# Patient Record
Sex: Female | Born: 1935 | Race: White | Hispanic: No | Smoking: Never smoker
Health system: Southern US, Community
[De-identification: ages and names within clinical notes are randomized; demographics above are authoritative.]

## PROBLEM LIST (undated history)

## (undated) DIAGNOSIS — M810 Age-related osteoporosis without current pathological fracture: Secondary | ICD-10-CM

## (undated) DIAGNOSIS — S52601A Unspecified fracture of lower end of right ulna, initial encounter for closed fracture: Secondary | ICD-10-CM

## (undated) DIAGNOSIS — S52501A Unspecified fracture of the lower end of right radius, initial encounter for closed fracture: Secondary | ICD-10-CM

## (undated) DIAGNOSIS — M1990 Unspecified osteoarthritis, unspecified site: Secondary | ICD-10-CM

## (undated) DIAGNOSIS — Z972 Presence of dental prosthetic device (complete) (partial): Secondary | ICD-10-CM

## (undated) DIAGNOSIS — Z8781 Personal history of (healed) traumatic fracture: Secondary | ICD-10-CM

## (undated) DIAGNOSIS — I10 Essential (primary) hypertension: Secondary | ICD-10-CM

## (undated) DIAGNOSIS — J45909 Unspecified asthma, uncomplicated: Secondary | ICD-10-CM

## (undated) DIAGNOSIS — M159 Polyosteoarthritis, unspecified: Secondary | ICD-10-CM

## (undated) HISTORY — PX: KIDNEY STONE SURGERY: SHX686

## (undated) HISTORY — PX: COLONOSCOPY: SHX174

## (undated) HISTORY — DX: Polyosteoarthritis, unspecified: M15.9

## (undated) HISTORY — DX: Personal history of (healed) traumatic fracture: Z87.81

## (undated) HISTORY — PX: TUBAL LIGATION: SHX77

---

## 1988-06-06 HISTORY — PX: CHOLECYSTECTOMY: SHX55

## 1999-08-11 ENCOUNTER — Encounter: Payer: Self-pay | Admitting: Cardiology

## 1999-08-11 ENCOUNTER — Encounter: Admission: RE | Admit: 1999-08-11 | Discharge: 1999-08-11 | Payer: Self-pay | Admitting: Cardiology

## 1999-08-12 ENCOUNTER — Ambulatory Visit (HOSPITAL_COMMUNITY): Admission: RE | Admit: 1999-08-12 | Discharge: 1999-08-12 | Payer: Self-pay

## 1999-09-10 ENCOUNTER — Ambulatory Visit (HOSPITAL_BASED_OUTPATIENT_CLINIC_OR_DEPARTMENT_OTHER): Admission: RE | Admit: 1999-09-10 | Discharge: 1999-09-10 | Payer: Self-pay | Admitting: Plastic Surgery

## 2000-11-02 ENCOUNTER — Encounter: Admission: RE | Admit: 2000-11-02 | Discharge: 2000-11-02 | Payer: Self-pay | Admitting: Family Medicine

## 2000-11-02 ENCOUNTER — Encounter: Payer: Self-pay | Admitting: Family Medicine

## 2001-02-08 ENCOUNTER — Ambulatory Visit (HOSPITAL_COMMUNITY): Admission: RE | Admit: 2001-02-08 | Discharge: 2001-02-08 | Payer: Self-pay | Admitting: Gastroenterology

## 2005-06-13 ENCOUNTER — Encounter: Admission: RE | Admit: 2005-06-13 | Discharge: 2005-06-13 | Payer: Self-pay | Admitting: Family Medicine

## 2005-07-20 ENCOUNTER — Encounter: Admission: RE | Admit: 2005-07-20 | Discharge: 2005-07-20 | Payer: Self-pay | Admitting: Neurology

## 2006-01-27 ENCOUNTER — Encounter: Admission: RE | Admit: 2006-01-27 | Discharge: 2006-01-27 | Payer: Self-pay | Admitting: Family Medicine

## 2006-03-09 ENCOUNTER — Encounter: Admission: RE | Admit: 2006-03-09 | Discharge: 2006-03-09 | Payer: Self-pay | Admitting: Family Medicine

## 2006-03-15 ENCOUNTER — Encounter: Admission: RE | Admit: 2006-03-15 | Discharge: 2006-03-15 | Payer: Self-pay | Admitting: *Deleted

## 2006-03-28 ENCOUNTER — Encounter: Admission: RE | Admit: 2006-03-28 | Discharge: 2006-03-28 | Payer: Self-pay | Admitting: Orthopaedic Surgery

## 2011-12-21 DIAGNOSIS — E782 Mixed hyperlipidemia: Secondary | ICD-10-CM | POA: Insufficient documentation

## 2011-12-21 DIAGNOSIS — M810 Age-related osteoporosis without current pathological fracture: Secondary | ICD-10-CM | POA: Insufficient documentation

## 2011-12-21 DIAGNOSIS — E559 Vitamin D deficiency, unspecified: Secondary | ICD-10-CM | POA: Insufficient documentation

## 2013-01-07 ENCOUNTER — Encounter (HOSPITAL_BASED_OUTPATIENT_CLINIC_OR_DEPARTMENT_OTHER): Payer: Self-pay | Admitting: *Deleted

## 2013-01-07 NOTE — Progress Notes (Signed)
Talked with daughter-she will bring her for [id number]

## 2013-01-07 NOTE — Progress Notes (Signed)
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

## 2013-01-10 ENCOUNTER — Encounter (HOSPITAL_BASED_OUTPATIENT_CLINIC_OR_DEPARTMENT_OTHER)
Admission: RE | Admit: 2013-01-10 | Discharge: 2013-01-10 | Disposition: A | Discharge status: Home / Self Care | Payer: Medicare Other | Source: Ambulatory Visit | Attending: Orthopedic Surgery | Admitting: Orthopedic Surgery

## 2013-01-10 ENCOUNTER — Other Ambulatory Visit: Payer: Self-pay

## 2013-01-10 DIAGNOSIS — S52609A Unspecified fracture of lower end of unspecified ulna, initial encounter for closed fracture: Secondary | ICD-10-CM | POA: Insufficient documentation

## 2013-01-10 DIAGNOSIS — Z01810 Encounter for preprocedural cardiovascular examination: Secondary | ICD-10-CM | POA: Insufficient documentation

## 2013-01-10 DIAGNOSIS — S52509A Unspecified fracture of the lower end of unspecified radius, initial encounter for closed fracture: Secondary | ICD-10-CM | POA: Insufficient documentation

## 2013-01-10 DIAGNOSIS — X58XXXA Exposure to other specified factors, initial encounter: Secondary | ICD-10-CM | POA: Insufficient documentation

## 2013-01-10 LAB — BASIC METABOLIC PANEL
BUN: 23 mg/dL (ref 6–23)
Chloride: 98 mEq/L (ref 96–112)
Glucose, Bld: 95 mg/dL (ref 70–99)
Potassium: 3.3 mEq/L — ABNORMAL LOW (ref 3.5–5.1)
Sodium: 140 mEq/L (ref 135–145)

## 2013-01-11 ENCOUNTER — Ambulatory Visit (HOSPITAL_BASED_OUTPATIENT_CLINIC_OR_DEPARTMENT_OTHER): Payer: Medicare Other | Admitting: Certified Registered Nurse Anesthetist

## 2013-01-11 ENCOUNTER — Ambulatory Visit (HOSPITAL_BASED_OUTPATIENT_CLINIC_OR_DEPARTMENT_OTHER)
Admission: RE | Admit: 2013-01-11 | Discharge: 2013-01-11 | Disposition: A | Discharge status: Home / Self Care | Payer: Medicare Other | Source: Ambulatory Visit | Attending: Orthopedic Surgery | Admitting: Orthopedic Surgery

## 2013-01-11 ENCOUNTER — Encounter (HOSPITAL_BASED_OUTPATIENT_CLINIC_OR_DEPARTMENT_OTHER)
Admission: RE | Disposition: A | Discharge status: Home / Self Care | Payer: Self-pay | Source: Ambulatory Visit | Attending: Orthopedic Surgery

## 2013-01-11 ENCOUNTER — Encounter (HOSPITAL_BASED_OUTPATIENT_CLINIC_OR_DEPARTMENT_OTHER): Payer: Self-pay

## 2013-01-11 ENCOUNTER — Encounter (HOSPITAL_BASED_OUTPATIENT_CLINIC_OR_DEPARTMENT_OTHER): Payer: Self-pay | Admitting: Certified Registered Nurse Anesthetist

## 2013-01-11 DIAGNOSIS — Y929 Unspecified place or not applicable: Secondary | ICD-10-CM | POA: Insufficient documentation

## 2013-01-11 DIAGNOSIS — S52501A Unspecified fracture of the lower end of right radius, initial encounter for closed fracture: Secondary | ICD-10-CM

## 2013-01-11 DIAGNOSIS — S52609A Unspecified fracture of lower end of unspecified ulna, initial encounter for closed fracture: Secondary | ICD-10-CM | POA: Insufficient documentation

## 2013-01-11 DIAGNOSIS — I10 Essential (primary) hypertension: Secondary | ICD-10-CM | POA: Insufficient documentation

## 2013-01-11 DIAGNOSIS — J45909 Unspecified asthma, uncomplicated: Secondary | ICD-10-CM | POA: Insufficient documentation

## 2013-01-11 DIAGNOSIS — S52509A Unspecified fracture of the lower end of unspecified radius, initial encounter for closed fracture: Secondary | ICD-10-CM | POA: Insufficient documentation

## 2013-01-11 DIAGNOSIS — Z79899 Other long term (current) drug therapy: Secondary | ICD-10-CM | POA: Insufficient documentation

## 2013-01-11 DIAGNOSIS — X58XXXA Exposure to other specified factors, initial encounter: Secondary | ICD-10-CM | POA: Insufficient documentation

## 2013-01-11 DIAGNOSIS — M810 Age-related osteoporosis without current pathological fracture: Secondary | ICD-10-CM | POA: Insufficient documentation

## 2013-01-11 DIAGNOSIS — M129 Arthropathy, unspecified: Secondary | ICD-10-CM | POA: Insufficient documentation

## 2013-01-11 DIAGNOSIS — Z01812 Encounter for preprocedural laboratory examination: Secondary | ICD-10-CM | POA: Insufficient documentation

## 2013-01-11 HISTORY — DX: Unspecified fracture of the lower end of right radius, initial encounter for closed fracture: S52.501A

## 2013-01-11 HISTORY — DX: Unspecified asthma, uncomplicated: J45.909

## 2013-01-11 HISTORY — DX: Essential (primary) hypertension: I10

## 2013-01-11 HISTORY — DX: Presence of dental prosthetic device (complete) (partial): Z97.2

## 2013-01-11 HISTORY — PX: OPEN REDUCTION INTERNAL FIXATION (ORIF) DISTAL RADIAL FRACTURE: SHX5989

## 2013-01-11 HISTORY — DX: Age-related osteoporosis without current pathological fracture: M81.0

## 2013-01-11 HISTORY — DX: Unspecified osteoarthritis, unspecified site: M19.90

## 2013-01-11 HISTORY — DX: Unspecified fracture of lower end of right ulna, initial encounter for closed fracture: S52.601A

## 2013-01-11 LAB — POCT HEMOGLOBIN-HEMACUE: Hemoglobin: 14.7 g/dL (ref 12.0–15.0)

## 2013-01-11 SURGERY — OPEN REDUCTION INTERNAL FIXATION (ORIF) DISTAL RADIUS FRACTURE
Anesthesia: General | Site: Wrist | Laterality: Right | Wound class: Clean

## 2013-01-11 MED ORDER — SENNA-DOCUSATE SODIUM 8.6-50 MG PO TABS: 2.0000 | ORAL_TABLET | Freq: Every day | ORAL | Status: DC

## 2013-01-11 MED ORDER — BUPIVACAINE-EPINEPHRINE PF 0.5-1:200000 % IJ SOLN
INTRAMUSCULAR | Status: DC | PRN
  Administered 2013-01-11: 25 mL

## 2013-01-11 MED ORDER — FENTANYL CITRATE 0.05 MG/ML IJ SOLN: 25.0000 ug | INTRAMUSCULAR | Status: DC | PRN

## 2013-01-11 MED ORDER — MIDAZOLAM HCL 2 MG/2ML IJ SOLN
1.0000 mg | INTRAMUSCULAR | Status: DC | PRN
  Administered 2013-01-11: 1 mg via INTRAVENOUS

## 2013-01-11 MED ORDER — CEFAZOLIN SODIUM-DEXTROSE 2-3 GM-% IV SOLR
2.0000 g | Freq: Once | INTRAVENOUS | Status: AC
  Administered 2013-01-11: 2 g via INTRAVENOUS

## 2013-01-11 MED ORDER — LACTATED RINGERS IV SOLN
INTRAVENOUS | Status: DC
  Administered 2013-01-11: 08:00:00 via INTRAVENOUS

## 2013-01-11 MED ORDER — HYDROCODONE-ACETAMINOPHEN 10-325 MG PO TABS: 1.0000 | ORAL_TABLET | Freq: Four times a day (QID) | ORAL | Status: DC | PRN

## 2013-01-11 MED ORDER — ONDANSETRON HCL 4 MG/2ML IJ SOLN: 4.0000 mg | Freq: Once | INTRAMUSCULAR | Status: DC | PRN

## 2013-01-11 MED ORDER — LIDOCAINE HCL (CARDIAC) 20 MG/ML IV SOLN
INTRAVENOUS | Status: DC | PRN
  Administered 2013-01-11: 50 mg via INTRAVENOUS

## 2013-01-11 MED ORDER — ONDANSETRON HCL 4 MG/2ML IJ SOLN
INTRAMUSCULAR | Status: DC | PRN
  Administered 2013-01-11: 4 mg via INTRAVENOUS

## 2013-01-11 MED ORDER — FENTANYL CITRATE 0.05 MG/ML IJ SOLN
50.0000 ug | INTRAMUSCULAR | Status: DC | PRN
  Administered 2013-01-11: 100 ug via INTRAVENOUS

## 2013-01-11 MED ORDER — DEXAMETHASONE SODIUM PHOSPHATE 4 MG/ML IJ SOLN
INTRAMUSCULAR | Status: DC | PRN
  Administered 2013-01-11: 4 mg

## 2013-01-11 MED ORDER — PROPOFOL 10 MG/ML IV BOLUS
INTRAVENOUS | Status: DC | PRN
  Administered 2013-01-11: 100 mg via INTRAVENOUS

## 2013-01-11 MED ORDER — DEXAMETHASONE SODIUM PHOSPHATE 10 MG/ML IJ SOLN
INTRAMUSCULAR | Status: DC | PRN
  Administered 2013-01-11: 10 mg via INTRAVENOUS

## 2013-01-11 SURGICAL SUPPLY — 69 items
APL SKNCLS STERI-STRIP NONHPOA (GAUZE/BANDAGES/DRESSINGS) ×1
BANDAGE ELASTIC 3 VELCRO ST LF (GAUZE/BANDAGES/DRESSINGS) IMPLANT
BANDAGE ELASTIC 4 VELCRO ST LF (GAUZE/BANDAGES/DRESSINGS) IMPLANT
BENZOIN TINCTURE PRP APPL 2/3 (GAUZE/BANDAGES/DRESSINGS) ×2 IMPLANT
BIT DRILL 2 FAST STEP (BIT) ×1 IMPLANT
BIT DRILL 2.5X4 QC (BIT) ×1 IMPLANT
BLADE MINI RND TIP GREEN BEAV (BLADE) IMPLANT
BLADE SURG 15 STRL LF DISP TIS (BLADE) ×1 IMPLANT
BLADE SURG 15 STRL SS (BLADE) ×2
BNDG CMPR 9X4 STRL LF SNTH (GAUZE/BANDAGES/DRESSINGS) ×1
BNDG COHESIVE 4X5 TAN STRL (GAUZE/BANDAGES/DRESSINGS) IMPLANT
BNDG ESMARK 4X9 LF (GAUZE/BANDAGES/DRESSINGS) ×2 IMPLANT
CLOTH BEACON ORANGE TIMEOUT ST (SAFETY) ×2 IMPLANT
COVER TABLE BACK 60X90 (DRAPES) ×2 IMPLANT
CUFF TOURNIQUET SINGLE 18IN (TOURNIQUET CUFF) ×1 IMPLANT
DECANTER SPIKE VIAL GLASS SM (MISCELLANEOUS) ×2 IMPLANT
DRAPE EXTREMITY T 121X128X90 (DRAPE) ×2 IMPLANT
DRAPE INCISE IOBAN 66X45 STRL (DRAPES) ×1 IMPLANT
DRAPE OEC MINIVIEW 54X84 (DRAPES) ×2 IMPLANT
DRAPE SURG 17X23 STRL (DRAPES) ×2 IMPLANT
DRAPE U 20/CS (DRAPES) ×2 IMPLANT
DURAPREP 26ML APPLICATOR (WOUND CARE) ×2 IMPLANT
ELECT REM PT RETURN 9FT ADLT (ELECTROSURGICAL) ×2
ELECTRODE REM PT RTRN 9FT ADLT (ELECTROSURGICAL) ×1 IMPLANT
GLOVE BIO SURGEON STRL SZ 6.5 (GLOVE) ×1 IMPLANT
GLOVE BIOGEL PI IND STRL 7.0 (GLOVE) IMPLANT
GLOVE BIOGEL PI IND STRL 8 (GLOVE) ×2 IMPLANT
GLOVE BIOGEL PI INDICATOR 7.0 (GLOVE) ×1
GLOVE BIOGEL PI INDICATOR 8 (GLOVE) ×2
GLOVE ORTHO TXT STRL SZ7.5 (GLOVE) ×2 IMPLANT
GLOVE SURG ORTHO 8.0 STRL STRW (GLOVE) ×2 IMPLANT
GOWN BRE IMP PREV XXLGXLNG (GOWN DISPOSABLE) ×4 IMPLANT
K-WIRE .062X4 (WIRE) ×2 IMPLANT
NDL HYPO 25X1 1.5 SAFETY (NEEDLE) IMPLANT
NEEDLE HYPO 25X1 1.5 SAFETY (NEEDLE) IMPLANT
NS IRRIG 1000ML POUR BTL (IV SOLUTION) ×2 IMPLANT
PACK BASIN DAY SURGERY FS (CUSTOM PROCEDURE TRAY) ×2 IMPLANT
PAD CAST 3X4 CTTN HI CHSV (CAST SUPPLIES) IMPLANT
PAD CAST 4YDX4 CTTN HI CHSV (CAST SUPPLIES) IMPLANT
PADDING CAST ABS 4INX4YD NS (CAST SUPPLIES) ×1
PADDING CAST ABS COTTON 4X4 ST (CAST SUPPLIES) ×1 IMPLANT
PADDING CAST COTTON 3X4 STRL (CAST SUPPLIES)
PADDING CAST COTTON 4X4 STRL (CAST SUPPLIES)
PEG SUBCHONDRAL SMOOTH 2.0X18 (Peg) ×2 IMPLANT
PEG SUBCHONDRAL SMOOTH 2.0X20 (Peg) ×4 IMPLANT
PENCIL BUTTON HOLSTER BLD 10FT (ELECTRODE) ×2 IMPLANT
PLATE SHORT 21.6X48.9 NRRW RT (Plate) ×1 IMPLANT
SCREW BN 12X3.5XNS CORT TI (Screw) IMPLANT
SCREW CORT 3.5X12 (Screw) ×4 IMPLANT
SLEEVE SCD COMPRESS KNEE MED (MISCELLANEOUS) ×2 IMPLANT
SPLINT PLASTER CAST XFAST 3X15 (CAST SUPPLIES) IMPLANT
SPLINT PLASTER XTRA FASTSET 3X (CAST SUPPLIES)
SPONGE GAUZE 4X4 12PLY (GAUZE/BANDAGES/DRESSINGS) ×2 IMPLANT
STOCKINETTE 4X48 STRL (DRAPES) ×2 IMPLANT
STRIP CLOSURE SKIN 1/2X4 (GAUZE/BANDAGES/DRESSINGS) ×2 IMPLANT
SUCTION FRAZIER TIP 10 FR DISP (SUCTIONS) ×2 IMPLANT
SUT ETHILON 3 0 PS 1 (SUTURE) IMPLANT
SUT ETHILON 4 0 PS 2 18 (SUTURE) IMPLANT
SUT MNCRL AB 4-0 PS2 18 (SUTURE) ×1 IMPLANT
SUT VIC AB 0 CT1 27 (SUTURE)
SUT VIC AB 0 CT1 27XBRD ANBCTR (SUTURE) IMPLANT
SUT VIC AB 2-0 SH 27 (SUTURE) ×2
SUT VIC AB 2-0 SH 27XBRD (SUTURE) IMPLANT
SUT VICRYL 3-0 CR8 SH (SUTURE) ×2 IMPLANT
SYR BULB 3OZ (MISCELLANEOUS) ×2 IMPLANT
SYR CONTROL 10ML LL (SYRINGE) IMPLANT
TOWEL OR 17X24 6PK STRL BLUE (TOWEL DISPOSABLE) ×2 IMPLANT
TUBE CONNECTING 20X1/4 (TUBING) ×2 IMPLANT
UNDERPAD 30X30 INCONTINENT (UNDERPADS AND DIAPERS) ×2 IMPLANT

## 2013-01-11 NOTE — Anesthesia Procedure Notes (Addendum)
Anesthesia Regional Block:  Supraclavicular block  Pre-Anesthetic Checklist: ,, timeout performed, Correct Patient, Correct Site, Correct Laterality, Correct Procedure, Correct Position, site marked, Risks and benefits discussed,  Surgical consent,  Pre-op evaluation,  At surgeon's request and post-op pain management  Laterality: Right and Upper  Prep: chloraprep       Needles:  Injection technique: Single-shot  Needle Type: Echogenic Stimulator Needle     Needle Length: 5cm 5 cm Needle Gauge: 21    Additional Needles:  Procedures: ultrasound guided (picture in chart) Supraclavicular block Narrative:  Start time: 01/11/2013 8:01 AM End time: 01/11/2013 8:10 AM Injection made incrementally with aspirations every 5 mL.  Performed by: Personally  Anesthesiologist: Sheldon Silvan  Supraclavicular block Procedure Name: LMA Insertion Performed by: York Grice Pre-anesthesia Checklist: Patient identified, Timeout performed, Emergency Drugs available, Suction available and Patient being monitored Patient Re-evaluated:Patient Re-evaluated prior to inductionOxygen Delivery Method: Circle system utilized Preoxygenation: Pre-oxygenation with 100% oxygen Intubation Type: IV induction Ventilation: Mask ventilation without difficulty LMA: LMA inserted LMA Size: 4.0 Number of attempts: 1 Placement Confirmation: breath sounds checked- equal and bilateral and positive ETCO2 Tube secured with: Tape Dental Injury: Teeth and Oropharynx as per pre-operative assessment

## 2013-01-11 NOTE — Anesthesia Preprocedure Evaluation (Signed)
Anesthesia Evaluation  Patient identified by MRN, date of birth, ID band Patient awake    Reviewed: Allergy & Precautions, H&P , NPO status , Patient's Chart, lab work & pertinent test results  Airway Mallampati: I TM Distance: >3 FB Neck ROM: Full    Dental  (+) Upper Dentures and Dental Advisory Given   Pulmonary  breath sounds clear to auscultation        Cardiovascular hypertension, Pt. on medications Rhythm:Regular Rate:Normal     Neuro/Psych    GI/Hepatic   Endo/Other    Renal/GU      Musculoskeletal   Abdominal   Peds  Hematology   Anesthesia Other Findings   Reproductive/Obstetrics                           Anesthesia Physical Anesthesia Plan  ASA: II  Anesthesia Plan: General   Post-op Pain Management:    Induction: Intravenous  Airway Management Planned: LMA  Additional Equipment:   Intra-op Plan:   Post-operative Plan: Extubation in OR  Informed Consent: I have reviewed the patients History and Physical, chart, labs and discussed the procedure including the risks, benefits and alternatives for the proposed anesthesia with the patient or authorized representative who has indicated his/her understanding and acceptance.     Plan Discussed with: CRNA, Anesthesiologist and Surgeon  Anesthesia Plan Comments:         Anesthesia Quick Evaluation

## 2013-01-11 NOTE — Op Note (Signed)
01/11/2013  10:03 AM  PATIENT:  Monica Caldwell    PRE-OPERATIVE DIAGNOSIS:  Right distal radius and ulna fracture  POST-OPERATIVE DIAGNOSIS:  Same  PROCEDURE:  ORIF DISTAL RADIUS FRACTURE, 2 pieces, with percutaneous skeletal fixation of the distal ulna  SURGEON:  Eulas Post, MD  PHYSICIAN ASSISTANT: Janace Litten, OPA-C, present and scrubbed throughout the case, critical for completion in a timely fashion, and for retraction, instrumentation, and closure.  ANESTHESIA:   General  PREOPERATIVE INDICATIONS:  Monica Caldwell is a  79 y.o. female who had a displaced distal radius and ulna fracture, and elected for surgical management.  The risks benefits and alternatives were discussed with the patient preoperatively including but not limited to the risks of infection, bleeding, nerve injury, cardiopulmonary complications, the need for revision surgery, tendon rupture, hardware prominence, hardware failure, nonunion, malunion, post-traumatic arthritis, regional pain syndrome, among others, and the patient was willing to proceed.  OPERATIVE IMPLANTS: Biomet DVR volar plate with 2 proximal cortical screws and multiple distal interlocking smooth pegs, using the short narrow plate. I used a single 0.625 K wire for the ulna.  OPERATIVE FINDINGS: Comminution of the distal radius fracture, with shortening, and dorsal angulation, with loss of height of the distal ulna.  OPERATIVE PROCEDURE: The patient was brought to the operating room and placed in the supine position. General anesthesia was administered. IV antibiotics were given. Time out was performed. The upper extremity was prepped and draped in usual sterile fashion. The arm was elevated and exsanguinated and the tourniquet was inflated at hg.    Volar approach to the distal radius was carried out, and the flexor carpi radialis was retracted radially. The radial artery was protected throughout the case.  Deep dissection was  carried down, and the pronator quadratus was elevated off of the radius. The fracture site was identified and cleaned and reduced anatomically. This keyed into place nicely.   I held this provisionally with a K wire, and C-arm used to confirm alignment.   I had restored height and inclination and then applied a volar plate. A K wire was used to confirm appropriate position of the plate, and once I was satisfied with the overall alignment I was able to secure the plate proximally with a cortical screw.   I then secured the fracture with multiple smooth interlocking pegs distally, and confirmed that none of these were in the joint, and none of these were penetrating the dorsal cortex. I also secured the plate proximally with one more cortical screw. I confirmed final screw length and position with live fluoroscopy, and after reduction of the radius, the ulna was effectively anatomically reduced. It may been a little bit short, but overall satisfactory position. I placed a single bicortical 0.0625 inch K wire across the ulnar fracture site, and was satisfied with the fixation, although admittedly this was minimal fixation for the ulna, but I felt that more fixation would introduce increased risk for pin tract infection as well as problems with the DRUJ joint, and I did not feel that pinning the DRUJ joint was appropriate, do to risk for pin fracture, and loss of motion and stiffness, and so then I turned my attention back to the radial wound.  The wounds were irrigated copiously, and I repaired the pronator quadratus as well as possible with 2-0 Vicryl followed by 3-0 subcutaneous Vicryl for the skin and Steri-Strips and sterile gauze and a volar splint. The tourniquet was released. She was awakened and returned  back in stable and satisfactory condition. There were no complications and She tolerated the procedure well.

## 2013-01-11 NOTE — Anesthesia Postprocedure Evaluation (Signed)
  Anesthesia Post-op Note  Patient: Monica Caldwell  Procedure(s) Performed: Procedure(s): OPEN REDUCTION INTERNAL FIXATION (ORIF) DISTAL AND ULNA RADIAL FRACTURE (Right)  Patient Location: PACU  Anesthesia Type:GA combined with regional for post-op pain  Level of Consciousness: awake, alert  and oriented  Airway and Oxygen Therapy: Patient Spontanous Breathing  Post-op Pain: none  Post-op Assessment: Post-op Vital signs reviewed  Post-op Vital Signs: Reviewed  Complications: No apparent anesthesia complications

## 2013-01-11 NOTE — Transfer of Care (Signed)
Immediate Anesthesia Transfer of Care Note  Patient: Monica Caldwell  Procedure(s) Performed: Procedure(s): OPEN REDUCTION INTERNAL FIXATION (ORIF) DISTAL AND ULNA RADIAL FRACTURE (Right)  Patient Location: PACU  Anesthesia Type:General  Level of Consciousness: awake and alert   Airway & Oxygen Therapy: Patient Spontanous Breathing and Patient connected to face mask oxygen  Post-op Assessment: Report given to PACU RN and Post -op Vital signs reviewed and stable  Post vital signs: Reviewed and stable  Complications: No apparent anesthesia complications

## 2013-01-11 NOTE — H&P (Signed)
PREOPERATIVE H&P  Chief Complaint: RIGHT WRIST FRACTURE DISTAL RADIUS/ULNA - CLOSED 813.41  HPI: Monica Caldwell is a 79 y.o. female who presents for preoperative history and physical with a diagnosis of RIGHT WRIST COLLES SMITH FRACTURE DISTAL RADIUS/ULNA - CLOSED 813.41. Symptoms are rated as moderate to severe, and have been worsening.  This is significantly impairing activities of daily living.  She has elected for surgical management. There is significant displacement, and she has goals of maintaining independence and full hand/wrist function.  Past Medical History  Diagnosis Date  . Hypertension   . Asthma   . Arthritis   . Osteoporosis   . Wears dentures     top   Past Surgical History  Procedure Laterality Date  . Kidney stone surgery      removal lt stone  . Tubal ligation    . Cholecystectomy    . Colonoscopy     History   Social History  . Marital Status: Widowed    Spouse Name: N/A    Number of Children: N/A  . Years of Education: N/A   Social History Main Topics  . Smoking status: Never Smoker   . Smokeless tobacco: None  . Alcohol Use: No  . Drug Use: No  . Sexually Active: None   Other Topics Concern  . None   Social History Narrative  . None   History reviewed. No pertinent family history. No Known Allergies Prior to Admission medications   Medication Sig Start Date End Date Taking? Authorizing Provider  albuterol (PROVENTIL HFA;VENTOLIN HFA) 108 (90 BASE) MCG/ACT inhaler Inhale 2 puffs into the lungs every 6 (six) hours as needed for wheezing.   Yes Historical Provider, MD  alendronate (FOSAMAX) 70 MG tablet Take 70 mg by mouth every 7 (seven) days. Take with a full glass of water on an empty stomach.   Yes Historical Provider, MD  calcium carbonate (OS-CAL) 1250 MG chewable tablet Chew 1 tablet by mouth daily.   Yes Historical Provider, MD  cholecalciferol (VITAMIN D) 1000 UNITS tablet Take 1,000 Units by mouth daily.   Yes Historical  Provider, MD  fluticasone-salmeterol (ADVAIR HFA) 115-21 MCG/ACT inhaler Inhale 2 puffs into the lungs 2 (two) times daily.   Yes Historical Provider, MD  furosemide (LASIX) 20 MG tablet Take 20 mg by mouth. As needed   Yes Historical Provider, MD  triamterene-hydrochlorothiazide (DYAZIDE) 37.5-25 MG per capsule Take 1 capsule by mouth every morning.   Yes Historical Provider, MD  pregabalin (LYRICA) 25 MG capsule Take 25 mg by mouth as needed.    Historical Provider, MD     Positive ROS: All other systems have been reviewed and were otherwise negative with the exception of those mentioned in the HPI and as above.  Physical Exam: General: Alert, no acute distress Cardiovascular: No pedal edema Respiratory: No cyanosis, no use of accessory musculature GI: No organomegaly, abdomen is soft and non-tender Skin: No lesions in the area of chief complaint Neurologic: Sensation intact distally Psychiatric: Patient is competent for consent with normal mood and affect Lymphatic: No axillary or cervical lymphadenopathy  MUSCULOSKELETAL: right wrist with pain to palpation, positive ecchymosis, sensation intact throughout.  Assessment: RIGHT WRIST FRACTURE DISTAL RADIUS/ULNA - CLOSED 813.41  Plan: Plan for Procedure(s): OPEN REDUCTION INTERNAL FIXATION (ORIF) DISTAL RADIAL FRACTURE  The risks benefits and alternatives were discussed with the patient including but not limited to the risks of nonoperative treatment, versus surgical intervention including infection, bleeding, nerve injury, malunion, nonunion, the need  for revision surgery, hardware prominence, hardware failure, the need for hardware removal, blood clots, cardiopulmonary complications, morbidity, mortality, among others, and they were willing to proceed.     Eulas Post, MD Cell [phone]   01/11/2013 7:31 AM

## 2013-01-11 NOTE — Progress Notes (Signed)
Assisted Dr. Crews with right, ultrasound guided, supraclavicular block. Side rails up, monitors on throughout procedure. See vital signs in flow sheet. Tolerated Procedure well. 

## 2013-01-14 ENCOUNTER — Encounter (HOSPITAL_BASED_OUTPATIENT_CLINIC_OR_DEPARTMENT_OTHER): Payer: Self-pay | Admitting: Orthopedic Surgery

## 2016-08-26 ENCOUNTER — Emergency Department (HOSPITAL_COMMUNITY): Payer: Medicare Other

## 2016-08-26 ENCOUNTER — Inpatient Hospital Stay (HOSPITAL_COMMUNITY)
Admission: EM | Admit: 2016-08-26 | Discharge: 2016-08-30 | DRG: 482 | Disposition: A | Discharge status: Skilled Nursing Facility | Payer: Medicare Other | Attending: Internal Medicine | Admitting: Internal Medicine

## 2016-08-26 ENCOUNTER — Encounter (HOSPITAL_COMMUNITY): Payer: Self-pay | Admitting: Emergency Medicine

## 2016-08-26 DIAGNOSIS — R011 Cardiac murmur, unspecified: Secondary | ICD-10-CM | POA: Diagnosis present

## 2016-08-26 DIAGNOSIS — J449 Chronic obstructive pulmonary disease, unspecified: Secondary | ICD-10-CM | POA: Diagnosis present

## 2016-08-26 DIAGNOSIS — I517 Cardiomegaly: Secondary | ICD-10-CM | POA: Diagnosis not present

## 2016-08-26 DIAGNOSIS — Z419 Encounter for procedure for purposes other than remedying health state, unspecified: Secondary | ICD-10-CM

## 2016-08-26 DIAGNOSIS — R52 Pain, unspecified: Secondary | ICD-10-CM

## 2016-08-26 DIAGNOSIS — Z66 Do not resuscitate: Secondary | ICD-10-CM | POA: Diagnosis present

## 2016-08-26 DIAGNOSIS — T458X5A Adverse effect of other primarily systemic and hematological agents, initial encounter: Secondary | ICD-10-CM | POA: Diagnosis present

## 2016-08-26 DIAGNOSIS — I119 Hypertensive heart disease without heart failure: Secondary | ICD-10-CM | POA: Diagnosis present

## 2016-08-26 DIAGNOSIS — I10 Essential (primary) hypertension: Secondary | ICD-10-CM | POA: Diagnosis not present

## 2016-08-26 DIAGNOSIS — Z801 Family history of malignant neoplasm of trachea, bronchus and lung: Secondary | ICD-10-CM | POA: Diagnosis not present

## 2016-08-26 DIAGNOSIS — E876 Hypokalemia: Secondary | ICD-10-CM | POA: Diagnosis present

## 2016-08-26 DIAGNOSIS — Z7983 Long term (current) use of bisphosphonates: Secondary | ICD-10-CM

## 2016-08-26 DIAGNOSIS — S7291XA Unspecified fracture of right femur, initial encounter for closed fracture: Secondary | ICD-10-CM | POA: Diagnosis not present

## 2016-08-26 DIAGNOSIS — J4520 Mild intermittent asthma, uncomplicated: Secondary | ICD-10-CM

## 2016-08-26 DIAGNOSIS — M80051A Age-related osteoporosis with current pathological fracture, right femur, initial encounter for fracture: Secondary | ICD-10-CM | POA: Diagnosis present

## 2016-08-26 DIAGNOSIS — Z8249 Family history of ischemic heart disease and other diseases of the circulatory system: Secondary | ICD-10-CM | POA: Diagnosis not present

## 2016-08-26 DIAGNOSIS — M84750A Atypical femoral fracture, unspecified, initial encounter for fracture: Secondary | ICD-10-CM | POA: Diagnosis present

## 2016-08-26 DIAGNOSIS — S72301A Unspecified fracture of shaft of right femur, initial encounter for closed fracture: Secondary | ICD-10-CM | POA: Diagnosis not present

## 2016-08-26 DIAGNOSIS — Z09 Encounter for follow-up examination after completed treatment for conditions other than malignant neoplasm: Secondary | ICD-10-CM

## 2016-08-26 DIAGNOSIS — S72001A Fracture of unspecified part of neck of right femur, initial encounter for closed fracture: Secondary | ICD-10-CM | POA: Diagnosis present

## 2016-08-26 LAB — BASIC METABOLIC PANEL
ANION GAP: 7 (ref 5–15)
BUN: 22 mg/dL — ABNORMAL HIGH (ref 6–20)
CALCIUM: 8.9 mg/dL (ref 8.9–10.3)
CO2: 28 mmol/L (ref 22–32)
CREATININE: 1.02 mg/dL — AB (ref 0.44–1.00)
Chloride: 107 mmol/L (ref 101–111)
GFR calc Af Amer: 56 mL/min — ABNORMAL LOW (ref >60)
GFR, EST NON AFRICAN AMERICAN: 49 mL/min — AB (ref >60)
Glucose, Bld: 143 mg/dL — ABNORMAL HIGH (ref 65–99)
Potassium: 3.1 mmol/L — ABNORMAL LOW (ref 3.5–5.1)
SODIUM: 142 mmol/L (ref 135–145)

## 2016-08-26 LAB — CBC WITH DIFFERENTIAL/PLATELET
BASOS ABS: 0.1 10*3/uL (ref 0.0–0.1)
BASOS PCT: 1 %
EOS ABS: 0.2 10*3/uL (ref 0.0–0.7)
Eosinophils Relative: 2 %
HCT: 37 % (ref 36.0–46.0)
HEMOGLOBIN: 12.2 g/dL (ref 12.0–15.0)
Lymphocytes Relative: 28 %
Lymphs Abs: 2.5 10*3/uL (ref 0.7–4.0)
MCH: 29.2 pg (ref 26.0–34.0)
MCHC: 33 g/dL (ref 30.0–36.0)
MCV: 88.5 fL (ref 78.0–100.0)
MONOS PCT: 6 %
Monocytes Absolute: 0.6 10*3/uL (ref 0.1–1.0)
NEUTROS PCT: 63 %
Neutro Abs: 5.8 10*3/uL (ref 1.7–7.7)
Platelets: 258 10*3/uL (ref 150–400)
RBC: 4.18 MIL/uL (ref 3.87–5.11)
RDW: 14.2 % (ref 11.5–15.5)
WBC: 9.1 10*3/uL (ref 4.0–10.5)

## 2016-08-26 LAB — TYPE AND SCREEN
ABO/RH(D): B POS
Antibody Screen: NEGATIVE

## 2016-08-26 LAB — PROTIME-INR
INR: 1.01
PROTHROMBIN TIME: 13.3 s (ref 11.4–15.2)

## 2016-08-26 LAB — MAGNESIUM: MAGNESIUM: 2 mg/dL (ref 1.7–2.4)

## 2016-08-26 LAB — ABO/RH: ABO/RH(D): B POS

## 2016-08-26 MED ORDER — ONDANSETRON HCL 4 MG/2ML IJ SOLN
4.0000 mg | Freq: Once | INTRAMUSCULAR | Status: AC
  Administered 2016-08-26: 4 mg via INTRAVENOUS
  Filled 2016-08-26: qty 2

## 2016-08-26 MED ORDER — SODIUM CHLORIDE 0.9 % IV SOLN: 30.0000 meq | Freq: Once | INTRAVENOUS | Status: DC

## 2016-08-26 MED ORDER — POTASSIUM CHLORIDE CRYS ER 20 MEQ PO TBCR: 40.0000 meq | EXTENDED_RELEASE_TABLET | Freq: Once | ORAL | Status: DC

## 2016-08-26 MED ORDER — FENTANYL CITRATE (PF) 100 MCG/2ML IJ SOLN
50.0000 ug | Freq: Once | INTRAMUSCULAR | Status: AC
  Administered 2016-08-26: 50 ug via INTRAVENOUS
  Filled 2016-08-26: qty 2

## 2016-08-26 MED ORDER — MORPHINE SULFATE (PF) 4 MG/ML IV SOLN
4.0000 mg | Freq: Once | INTRAVENOUS | Status: AC
  Administered 2016-08-26: 4 mg via INTRAVENOUS
  Filled 2016-08-26: qty 1

## 2016-08-26 MED ORDER — POTASSIUM CHLORIDE 10 MEQ/100ML IV SOLN
10.0000 meq | INTRAVENOUS | Status: AC
  Administered 2016-08-27 (×3): 10 meq via INTRAVENOUS
  Filled 2016-08-26 (×5): qty 100

## 2016-08-26 NOTE — ED Triage Notes (Signed)
Per EMS pt went to stand up, turned and fell. C/o R hip pain and femur pain 8/10. No LOC, pt denies hitting head. Pt confirms sensation in R leg/foot.  Fentanyl 200mcg given in route. Pain now 3/10. Pedal pulse dopplered by Rubin PayorPickering, MD.

## 2016-08-26 NOTE — H&P (Signed)
    Patient ID: Monica Caldwell MRN: [id number] DOB/AGE: 81/01/1929 81 y.o.  Admit date: 08/26/2016  Admission Diagnoses:  Right Femur Frcature  HPI: Pleasant 83 year old female presents to the ED after a trama in her home.  The pt reports standing from a seated position.  She reports she tried to turn and walk and her legs gave out on her and she fell.  He daughter was in the home so someone was there immediately after the trama and EMS was called.  Pt reports significant pain in her right LE and is unable to bear weight.  Past Medical History: Past Medical History:  Diagnosis Date  . Arthritis   . Asthma   . Closed fracture of right distal radius and ulna 01/11/2013  . Hypertension   . Osteoporosis   . Wears dentures    top    Surgical History: Past Surgical History:  Procedure Laterality Date  . CHOLECYSTECTOMY    . COLONOSCOPY    . KIDNEY STONE SURGERY     removal lt stone  . OPEN REDUCTION INTERNAL FIXATION (ORIF) DISTAL RADIAL FRACTURE Right 01/11/2013   Procedure: OPEN REDUCTION INTERNAL FIXATION (ORIF) DISTAL AND ULNA RADIAL FRACTURE;  Surgeon: Eulas PostJoshua P Landau, MD;  Location: [facility];  Service: Orthopedics;  Laterality: Right;  . TUBAL LIGATION      Family History: No family history on file.  Social History: Social History   Social History  . Marital status: Widowed    Spouse name: N/A  . Number of children: N/A  . Years of education: N/A   Occupational History  . Not on file.   Social History Main Topics  . Smoking status: Never Smoker  . Smokeless tobacco: Not on file  . Alcohol use No  . Drug use: No  . Sexual activity: Not on file   Other Topics Concern  . Not on file   Social History Narrative  . No narrative on file    Allergies: Patient has no known allergies.  Medications: I have reviewed the patient's current medications.  Vital Signs: Patient Vitals for the past 24 hrs:  BP Temp Temp src Pulse Resp SpO2 Height  Weight  08/26/16 1826 (!) 169/81 98.4 F (36.9 C) Oral 83 18 92 % - -  08/26/16 1824 - - - - - - 5\' 4"  (1.626 m) 74.8 kg (165 lb)  08/26/16 1819 - - - - - 96 % - -    Radiology: No results found.  Labs: No results for input(s): WBC, RBC, HCT, PLT in the last 72 hours. No results for input(s): NA, K, CL, CO2, BUN, CREATININE, GLUCOSE, CALCIUM in the last 72 hours. No results for input(s): LABPT, INR in the last 72 hours.  Review of Systems: ROS  Physical Exam: Neurologically intact ABD soft Sensation intact distally Dorsiflexion/Plantar flexion intact Compartment soft  TTP of the R proximal LE pts leg is angulated   Assessment and Plan: Xrays are being done in the ED Consulted with Dr. Katha HammingBrooks  Airel Magadan, MD [facility] [phone]   Case and images reviewed with Dr Linna CapriceSwinteck Plan on fracture fixation tomorrow provided she has been medical cleared [facility] Dr Linna CapriceSwinteck to fix Saturday

## 2016-08-26 NOTE — ED Notes (Signed)
Patient transported to X-ray 

## 2016-08-26 NOTE — ED Provider Notes (Signed)
[facility]-EMERGENCY DEPT Provider Note   CSN: [id number] Arrival date & time: 08/26/16  1809     History   Chief Complaint Chief Complaint  Patient presents with  . Leg Injury    R Femur    HPI ALECHIA LEZAMA is a 83 y.o. female.  HPI Patient resents with fall. Understand upturned and pain in her right hip and deformity of her mid femur. No other injury. Given 200 g of fentanyl by EMS. Pain improved somewhat. Patient denies having seen an orthopedic surgeon before but appears to have seen Dr. Dion Saucier for her wrist surgery. She is not on anticoagulation. No neck chest or abdominal pain.  Past Medical History:  Diagnosis Date  . Arthritis   . Asthma   . Closed fracture of right distal radius and ulna 01/11/2013  . Hypertension   . Osteoporosis   . Wears dentures    top    Patient Active Problem List   Diagnosis Date Noted  . Hypokalemia 08/26/2016  . Closed fracture of right femur, unspecified fracture morphology, initial encounter (HCC) 08/26/2016  . Hypertension 08/26/2016  . Asthma 08/26/2016  . Femur fracture, right (HCC) 08/26/2016  . Cardiomegaly 08/26/2016  . Heart murmur 08/26/2016  . Closed fracture of right distal radius and ulna 01/11/2013    Past Surgical History:  Procedure Laterality Date  . CHOLECYSTECTOMY    . COLONOSCOPY    . KIDNEY STONE SURGERY     removal lt stone  . OPEN REDUCTION INTERNAL FIXATION (ORIF) DISTAL RADIAL FRACTURE Right 01/11/2013   Procedure: OPEN REDUCTION INTERNAL FIXATION (ORIF) DISTAL AND ULNA RADIAL FRACTURE;  Surgeon: Eulas Post, MD;  Location: [facility];  Service: Orthopedics;  Laterality: Right;  . TUBAL LIGATION      OB History    No data available       Home Medications    Prior to Admission medications   Medication Sig Start Date End Date Taking? Authorizing Provider  ADVAIR DISKUS 100-50 MCG/DOSE AEPB Inhale 1 puff into the lungs daily.  08/22/16  Yes Historical Provider, MD  albuterol  (PROAIR HFA) 108 (90 Base) MCG/ACT inhaler INHALE 2 PUFFS BY MOUTH EVERY 4 HOURS AS NEEDED FOR RESCUE FROM WHEEZING OR SHORTNESS OF BREATH 07/10/16  Yes Historical Provider, MD  amLODipine (NORVASC) 10 MG tablet Take 10 mg by mouth daily.  09/08/15 09/07/16 Yes Historical Provider, MD  calcium carbonate (OS-CAL) 1250 MG chewable tablet Chew 1 tablet by mouth daily.   Yes Historical Provider, MD  cholecalciferol (VITAMIN D) 1000 UNITS tablet Take 1,000 Units by mouth daily.   Yes Historical Provider, MD  fluticasone-salmeterol (ADVAIR HFA) 115-21 MCG/ACT inhaler Inhale 2 puffs into the lungs 2 (two) times daily.   Yes Historical Provider, MD  furosemide (LASIX) 20 MG tablet Take 20 mg by mouth daily. As needed   Yes Historical Provider, MD  Ginseng 100 MG CAPS Take 1 capsule by mouth daily.    Yes Historical Provider, MD  pregabalin (LYRICA) 25 MG capsule TAKE 1-2 CAPSULES BY MOUTH EVERY 8 HOURS AS NEEDED FOR NERVE PANI 03/13/14  Yes Historical Provider, MD  triamterene-hydrochlorothiazide (MAXZIDE-25) 37.5-25 MG tablet Take 1 tablet by mouth daily.  08/22/16  Yes Historical Provider, MD  alendronate (FOSAMAX) 70 MG tablet Take 70 mg by mouth every 7 (seven) days. Take with a full glass of water on an empty stomach.    Historical Provider, MD  HYDROcodone-acetaminophen (NORCO) 10-325 MG per tablet Take 1-2 tablets by mouth  every 6 (six) hours as needed for pain. Patient not taking: Reported on 08/26/2016 01/11/13   Teryl Lucy, MD  sennosides-docusate sodium (SENOKOT-S) 8.6-50 MG tablet Take 2 tablets by mouth daily. Patient not taking: Reported on 08/26/2016 01/11/13   Teryl Lucy, MD    Family History Family History  Problem Relation Age of Onset  . CAD Mother   . Lung cancer Mother   . Hypertension Brother   . Hypertension Other   . Diabetes Neg Hx     Social History Social History  Substance Use Topics  . Smoking status: Never Smoker  . Smokeless tobacco: Never Used  . Alcohol use No      Allergies   Patient has no known allergies.   Review of Systems Review of Systems  Constitutional: Negative for appetite change.  HENT: Negative for congestion.   Respiratory: Negative for shortness of breath.   Cardiovascular: Negative for chest pain.  Gastrointestinal: Negative for abdominal pain.  Genitourinary: Negative for enuresis.  Musculoskeletal:       Right thigh and right hip pain.  Neurological: Negative for weakness and numbness.  Hematological: Negative for adenopathy.  Psychiatric/Behavioral: Negative for confusion.     Physical Exam Updated Vital Signs BP 128/61   Pulse 83   Temp 98.4 F (36.9 C) (Oral)   Resp 18   Ht 5\' 4"  (1.626 m)   Wt 165 lb (74.8 kg)   SpO2 96%   BMI 28.32 kg/m   Physical Exam  Constitutional: She appears well-developed.  HENT:  Head: Atraumatic.  Eyes: Pupils are equal, round, and reactive to light.  Neck: Neck supple.  Cardiovascular: Normal rate.   Pulmonary/Chest: Effort normal.  Abdominal: Soft. There is no tenderness.  Musculoskeletal: She exhibits tenderness.  Some tenderness right hip with obvious deformity of right mid femur. Neurovascular intact in foot. There is a dopplerable right DP pulse. Good capillary refill in the toes. No extremity tenderness on the other 3 extremities.  Neurological:  Patient with mild confusion. Appears to be somewhat hard of hearing. Moves all extremities. Moves feet to command.  Skin: Skin is warm. Capillary refill takes less than 2 seconds.  Psychiatric: She has a normal mood and affect.     ED Treatments / Results  Labs (all labs ordered are listed, but only abnormal results are displayed) Labs Reviewed  BASIC METABOLIC PANEL - Abnormal; Notable for the following:       Result Value   Potassium 3.1 (*)    Glucose, Bld 143 (*)    BUN 22 (*)    Creatinine, Ser 1.02 (*)    GFR calc non Af Amer 49 (*)    GFR calc Af Amer 56 (*)    All other components within normal limits   CBC WITH DIFFERENTIAL/PLATELET  PROTIME-INR  MAGNESIUM  TYPE AND SCREEN  ABO/RH    EKG  EKG Interpretation  Date/Time:  Friday August 26 2016 18:28:31 EDT Ventricular Rate:  75 PR Interval:    QRS Duration: 153 QT Interval:  443 QTC Calculation: 495 R Axis:   33 Text Interpretation:  Sinus rhythm Right bundle branch block Confirmed by Rubin Payor  MD, Harrold Donath [id number]) on 08/26/2016 6:53:04 PM       Radiology Dg Chest 1 View  Result Date: 08/26/2016 CLINICAL DATA:  Status post fall, with concern for chest injury. Initial encounter. EXAM: CHEST 1 VIEW COMPARISON:  None. FINDINGS: The lungs are well-aerated. Mild vascular congestion is noted. There is no evidence of focal  opacification, pleural effusion or pneumothorax. The cardiomediastinal silhouette is mildly enlarged. No acute osseous abnormalities are seen. IMPRESSION: Mild vascular congestion and mild cardiomegaly. Lungs remain grossly clear. Electronically Signed   By: Roanna RaiderJeffery  Chang M.D.   On: 08/26/2016 20:20   Dg Pelvis 1-2 Views  Result Date: 08/26/2016 CLINICAL DATA:  Status post fall, with right hip pain. Initial encounter. EXAM: PELVIS - 1-2 VIEW COMPARISON:  None. FINDINGS: There is no evidence of fracture or dislocation. Both femoral heads are seated normally within their respective acetabula. No significant degenerative change is appreciated. The sacroiliac joints are unremarkable in appearance. The visualized bowel gas pattern is grossly unremarkable in appearance. IMPRESSION: No evidence of fracture or dislocation. Electronically Signed   By: Roanna RaiderJeffery  Chang M.D.   On: 08/26/2016 19:56   Dg Femur, Min 2 Views Right  Result Date: 08/26/2016 CLINICAL DATA:  Thigh pain and deformity after falling. Initial encounter. EXAM: RIGHT FEMUR 2 VIEWS COMPARISON:  None. FINDINGS: There is a complete transverse fracture involving the diaphysis of the right femur at the junction of its proximal and middle thirds. This fracture  demonstrates significant apex anterior angulation and mild displacement. The femoral head and neck appear intact without dislocation. No injury identified at the knee. IMPRESSION: Significantly angulated and mildly displaced fracture of the proximal third of the right femoral diaphysis. Electronically Signed   By: Carey BullocksWilliam  Veazey M.D.   On: 08/26/2016 19:56    Procedures Procedures (including critical care time)  Medications Ordered in ED Medications  potassium chloride SA (K-DUR,KLOR-CON) CR tablet 40 mEq (0 mEq Oral Hold 08/27/16 0031)  potassium chloride 10 mEq in 100 mL IVPB (10 mEq Intravenous New Bag/Given 08/27/16 0024)  fentaNYL (SUBLIMAZE) injection 50 mcg (50 mcg Intravenous Given 08/26/16 1910)  fentaNYL (SUBLIMAZE) injection 50 mcg (50 mcg Intravenous Given 08/26/16 2029)  morphine 4 MG/ML injection 4 mg (4 mg Intravenous Given 08/26/16 2151)  ondansetron (ZOFRAN) injection 4 mg (4 mg Intravenous Given 08/26/16 2149)     Initial Impression / Assessment and Plan / ED Course  I have reviewed the triage vital signs and the nursing notes.  Pertinent labs & imaging results that were available during my care of the patient were reviewed by me and considered in my medical decision making (see chart for details).     Patient with femur fracture. No other apparent injury. Seen by ortho and [facility]. Admit to medicine.   Final Clinical Impressions(s) / ED Diagnoses   Final diagnoses:  Closed fracture of shaft of right femur, unspecified fracture morphology, initial encounter [facility](HCC)    New Prescriptions New Prescriptions   No medications on file     Benjiman CoreNathan Rockelle Heuerman, MD 08/27/16 [phone]

## 2016-08-26 NOTE — ED Notes (Signed)
Bed: [id number] Expected date:  Expected time:  Means of arrival:  Comments: Ems femur fx

## 2016-08-26 NOTE — H&P (Signed)
Monica Caldwell ZOX:[id number] DOB: 1929-02-19 DOA: 08/26/2016     [facility] Out[facility] Specialists:Orthopedics Dion Saucier   Patient coming from:    home Lives   With family    Chief Complaint: Right femur pain  HPI: Monica Caldwell is a 83 y.o. female with medical history significant of osteoporosis, asthma-COPD, hypertension    Presented with a fall. Patient stood up from sitting position try to turn may have lost balance and fell down. Very soon thereafter she was found by her daughter patient had severe pain in her right leg with deformity and was unable to bear weight and EMS was called. She denies loss of consciousness or head trauma patient was given fentanyl and 200 g with some improvement in pain. She gets occasional Short of breath due to asthma, not on oxygen, never smoker no hx of CAD or CHF but never had a stress test or echo. She is able to walk up the stairs and to the mail box without SOB or chest pain   Regarding pertinent Chronic problems: Patient has history of posterior paralysis and have had closed fracture of the right distal radius in 2014 Regarding patient history of COPD spirometry from 2017 FEV1 72% liters   FEV1/FVC 105% %   FVC 70% liters   PEAK FLOW 88% 20 - 800 L/MIN      IN ER:  Temp (24hrs), Avg:98.4 F (36.9 C), Min:98.4 F (36.9 C), Max:98.4 F (36.9 C)      RR 17, 93% HR  77  BP  163/63 K 3.1 Cr 1.02 WBC 9.1 Hg 12.2  Pelvic films no evidence of fracture dislocation  chest x-ray mild cardiomegaly Right femur as significantly angulated mildly displaced fracture of the proximal third of the right femoral diaphysis Following Medications were ordered in ER: Medications  fentaNYL (SUBLIMAZE) injection 50 mcg (50 mcg Intravenous Given 08/26/16 1910)  fentaNYL (SUBLIMAZE) injection 50 mcg (50 mcg Intravenous Given 08/26/16 2029)     ER provider discussed case with:  Dr. Shon Baton with orthopedics plan to transfer patient to [facility] plan to have operative repair in a.m. by Dr. Veda Canning  nothing by mouth post midnight  Hospitalist was called for admission for femoral fracture  Review of Systems:    Pertinent positives include: right leg pain  Constitutional:  No weight loss, night sweats, Fevers, chills, fatigue, weight loss  HEENT:  No headaches, Difficulty swallowing,Tooth/dental problems,Sore throat,  No sneezing, itching, ear ache, nasal congestion, post nasal drip,  Cardio-vascular:  No chest pain, Orthopnea, PND, anasarca, dizziness, palpitations.no Bilateral lower extremity swelling  GI:  No heartburn, indigestion, abdominal pain, nausea, vomiting, diarrhea, change in bowel habits, loss of appetite, melena, blood in stool, hematemesis Resp:  no shortness of breath at rest. No dyspnea on exertion, No excess mucus, no productive cough, No non-productive cough, No coughing up of blood.No change in color of mucus.No wheezing. Skin:  no rash or lesions. No jaundice GU:  no dysuria, change in color of urine, no urgency or frequency. No straining to urinate.  No flank pain.  Musculoskeletal:  No joint pain or no joint swelling. No decreased range of motion. No back pain.  Psych:  No change in mood or affect. No depression or anxiety. No memory loss.  Neuro: no localizing neurological complaints, no tingling, no weakness, no double vision, no gait abnormality, no slurred speech, no confusion  As per HPI otherwise 10 point review of systems negative.   Past  Medical History: Past Medical History:  Diagnosis Date  . Arthritis   . Asthma   . Closed fracture of right distal radius and ulna 01/11/2013  . Hypertension   . Osteoporosis   . Wears dentures    top   Past Surgical History:  Procedure Laterality Date  . CHOLECYSTECTOMY    . COLONOSCOPY    . KIDNEY STONE SURGERY     removal lt stone  . OPEN REDUCTION INTERNAL FIXATION (ORIF) DISTAL RADIAL FRACTURE Right 01/11/2013   Procedure: OPEN REDUCTION  INTERNAL FIXATION (ORIF) DISTAL AND ULNA RADIAL FRACTURE;  Surgeon: Eulas Post, MD;  Location: [facility];  Service: Orthopedics;  Laterality: Right;  . TUBAL LIGATION       Social History:  Ambulatory  Gilmer Mor    reports that she has never smoked. She has never used smokeless tobacco. She reports that she does not drink alcohol or use drugs.  Allergies:  No Known Allergies     Family History:   Family History  Problem Relation Age of Onset  . CAD Mother   . Lung cancer Mother   . Hypertension Brother   . Hypertension Other   . Diabetes Neg Hx     Medications: Prior to Admission medications   Medication Sig Start Date End Date Taking? Authorizing Provider  ADVAIR DISKUS 100-50 MCG/DOSE AEPB Inhale 1 puff into the lungs daily.  08/22/16  Yes Historical Provider, MD  albuterol (PROAIR HFA) 108 (90 Base) MCG/ACT inhaler INHALE 2 PUFFS BY MOUTH EVERY 4 HOURS AS NEEDED FOR RESCUE FROM WHEEZING OR SHORTNESS OF BREATH 07/10/16  Yes Historical Provider, MD  amLODipine (NORVASC) 10 MG tablet Take 10 mg by mouth daily.  09/08/15 09/07/16 Yes Historical Provider, MD  calcium carbonate (OS-CAL) 1250 MG chewable tablet Chew 1 tablet by mouth daily.   Yes Historical Provider, MD  cholecalciferol (VITAMIN D) 1000 UNITS tablet Take 1,000 Units by mouth daily.   Yes Historical Provider, MD  fluticasone-salmeterol (ADVAIR HFA) 115-21 MCG/ACT inhaler Inhale 2 puffs into the lungs 2 (two) times daily.   Yes Historical Provider, MD  furosemide (LASIX) 20 MG tablet Take 20 mg by mouth daily. As needed   Yes Historical Provider, MD  Ginseng 100 MG CAPS Take 1 capsule by mouth daily.    Yes Historical Provider, MD  pregabalin (LYRICA) 25 MG capsule TAKE 1-2 CAPSULES BY MOUTH EVERY 8 HOURS AS NEEDED FOR NERVE PANI 03/13/14  Yes Historical Provider, MD  triamterene-hydrochlorothiazide (MAXZIDE-25) 37.5-25 MG tablet Take 1 tablet by mouth daily.  08/22/16  Yes Historical Provider, MD    alendronate (FOSAMAX) 70 MG tablet Take 70 mg by mouth every 7 (seven) days. Take with a full glass of water on an empty stomach.    Historical Provider, MD  HYDROcodone-acetaminophen (NORCO) 10-325 MG per tablet Take 1-2 tablets by mouth every 6 (six) hours as needed for pain. Patient not taking: Reported on 08/26/2016 01/11/13   Teryl Lucy, MD  sennosides-docusate sodium (SENOKOT-S) 8.6-50 MG tablet Take 2 tablets by mouth daily. Patient not taking: Reported on 08/26/2016 01/11/13   Teryl Lucy, MD    Physical Exam: Patient Vitals for the past 24 hrs:  BP Temp Temp src Pulse Resp SpO2 Height Weight  08/26/16 2000 (!) 163/63 - - 77 17 93 % - -  08/26/16 1900 (!) 150/91 - - 64 15 98 % - -  08/26/16 1826 (!) 169/81 98.4 F (36.9 C) Oral 83 18 92 % - -  08/26/16 1824 - - - - - - 5\' 4"  (1.626 m) 74.8 kg (165 lb)  08/26/16 1819 - - - - - 96 % - -    1. General:  in No Acute distress 2. Psychological: Alert and Oriented 3. Head/ENT:     Dry Mucous Membranes                          Head Non traumatic, neck supple                           Poor Dentition 4. SKIN:  decreased Skin turgor,  Skin clean Dry and intact no rash 5. Heart: Regular rate and rhythm systolic Murmur, Rub or gallop 6. Lungs:   no wheezes or crackles   7. Abdomen: Soft,  non-tender, Non distended 8. Lower extremities: no clubbing, cyanosis, or edema 9. Neurologically Grossly intact, moving all 4 extremities equally   10. MSK: Normal range of motion in the right leg   body mass index is 28.32 kg/m.  Labs on Admission:   Labs on Admission: I have personally reviewed following labs and imaging studies  CBC:  Recent Labs Lab 08/26/16 1843  WBC 9.1  NEUTROABS 5.8  HGB 12.2  HCT 37.0  MCV 88.5  PLT 258   Basic Metabolic Panel:  Recent Labs Lab 08/26/16 1843  NA 142  K 3.1*  CL 107  CO2 28  GLUCOSE 143*  BUN 22*  CREATININE 1.02*  CALCIUM 8.9   GFR: Estimated Creatinine Clearance: 39.9 mL/min  (A) (by C-G formula based on SCr of 1.02 mg/dL (H)). Liver Function Tests: No results for input(s): AST, ALT, ALKPHOS, BILITOT, PROT, ALBUMIN in the last 168 hours. No results for input(s): LIPASE, AMYLASE in the last 168 hours. No results for input(s): AMMONIA in the last 168 hours. Coagulation Profile:  Recent Labs Lab 08/26/16 1843  INR 1.01   Cardiac Enzymes: No results for input(s): CKTOTAL, CKMB, CKMBINDEX, TROPONINI in the last 168 hours. BNP (last 3 results) No results for input(s): PROBNP in the last 8760 hours. HbA1C: No results for input(s): HGBA1C in the last 72 hours. CBG: No results for input(s): GLUCAP in the last 168 hours. Lipid Profile: No results for input(s): CHOL, HDL, LDLCALC, TRIG, CHOLHDL, LDLDIRECT in the last 72 hours. Thyroid Function Tests: No results for input(s): TSH, T4TOTAL, FREET4, T3FREE, THYROIDAB in the last 72 hours. Anemia Panel: No results for input(s): VITAMINB12, FOLATE, FERRITIN, TIBC, IRON, RETICCTPCT in the last 72 hours. Urine analysis: No results found for: COLORURINE, APPEARANCEUR, LABSPEC, PHURINE, GLUCOSEU, HGBUR, BILIRUBINUR, KETONESUR, PROTEINUR, UROBILINOGEN, NITRITE, LEUKOCYTESUR Sepsis Labs: @LABRCNTIP (procalcitonin:4,lacticidven:4) )No results found for this or any previous visit (from the past 240 hour(s)).    UA  ordered  No results found for: HGBA1C  Estimated Creatinine Clearance: 39.9 mL/min (A) (by C-G formula based on SCr of 1.02 mg/dL (H)).  BNP (last 3 results) No results for input(s): PROBNP in the last 8760 hours.   ECG REPORT  Independently reviewed Rate: 75  Rhythm: RBBB same as prior ST&T Change: No acute ischemic changes   QTC 495  Filed Weights   08/26/16 1824  Weight: 74.8 kg (165 lb)     Cultures: No results found for: SDES, SPECREQUEST, CULT, REPTSTATUS   Radiological Exams on Admission: Dg Chest 1 View  Result Date: 08/26/2016 CLINICAL DATA:  Status post fall, with concern for chest  injury. Initial encounter.  EXAM: CHEST 1 VIEW COMPARISON:  None. FINDINGS: The lungs are well-aerated. Mild vascular congestion is noted. There is no evidence of focal opacification, pleural effusion or pneumothorax. The cardiomediastinal silhouette is mildly enlarged. No acute osseous abnormalities are seen. IMPRESSION: Mild vascular congestion and mild cardiomegaly. Lungs remain grossly clear. Electronically Signed   By: Roanna Raider M.D.   On: 08/26/2016 20:20   Dg Pelvis 1-2 Views  Result Date: 08/26/2016 CLINICAL DATA:  Status post fall, with right hip pain. Initial encounter. EXAM: PELVIS - 1-2 VIEW COMPARISON:  None. FINDINGS: There is no evidence of fracture or dislocation. Both femoral heads are seated normally within their respective acetabula. No significant degenerative change is appreciated. The sacroiliac joints are unremarkable in appearance. The visualized bowel gas pattern is grossly unremarkable in appearance. IMPRESSION: No evidence of fracture or dislocation. Electronically Signed   By: Roanna Raider M.D.   On: 08/26/2016 19:56   Dg Femur, Min 2 Views Right  Result Date: 08/26/2016 CLINICAL DATA:  Thigh pain and deformity after falling. Initial encounter. EXAM: RIGHT FEMUR 2 VIEWS COMPARISON:  None. FINDINGS: There is a complete transverse fracture involving the diaphysis of the right femur at the junction of its proximal and middle thirds. This fracture demonstrates significant apex anterior angulation and mild displacement. The femoral head and neck appear intact without dislocation. No injury identified at the knee. IMPRESSION: Significantly angulated and mildly displaced fracture of the proximal third of the right femoral diaphysis. Electronically Signed   By: Carey Bullocks M.D.   On: 08/26/2016 19:56    Chart has been reviewed    Assessment/Plan   83 y.o. female with medical history significant of osteoporosis, asthma-COPD, hypertension admitted for femoral fracture  needing operative repair  Present on Admission:   . Closed fracture of right femur, unspecified fracture morphology, initial encounter [facility]) - plan for operative repair tomorrow at [facility], make nothing by mouth post midnight postoperatively patient will need PT OT evaluation and possibly placement. Patient does have advanced age and cardiomegaly with cardiac murmur which hasn't been worked up in the past. But she is very toward her and upper exercise able to ambulate without shortness of breath or chest pain and walk up the stairs.   Make in her intermediate risk. If there is a need to proceed with emergent operative intervention no further cardiac workup is indicated . Hypertension continue home medications . Asthma -able mild continue home medication Cardiomegaly and heart murmur will obtain echogram to evaluate . Hypokalemia - will replace, magnesium level  Other plan as per orders.  DVT prophylaxis:  SCD    Code Status:    DNR/DNI   as per patient     Family Communication:   Family  at  Bedside  plan of care was discussed with   Daughter  Disposition Plan:     likely will need placement for rehabilitation                                                Would benefit from PT/OT eval prior to DC                               Consults called: orthopedics    Admission status:   inpatient       Level  of care     medical floor         I have spent a total of  56 min on this admission   Davidlee Jeanbaptiste 08/26/2016, 10:36 PM    [facility]  Pager [phone]   after 2 AM please page floor coverage PA If 7AM-7PM, please contact the day team taking care of the patient  [email or web address]  Password [email or web address]

## 2016-08-27 ENCOUNTER — Encounter (HOSPITAL_COMMUNITY)
Admission: EM | Disposition: A | Discharge status: Skilled Nursing Facility | Payer: Self-pay | Source: Home / Self Care | Attending: Internal Medicine

## 2016-08-27 ENCOUNTER — Inpatient Hospital Stay (HOSPITAL_COMMUNITY): Payer: Medicare Other

## 2016-08-27 ENCOUNTER — Inpatient Hospital Stay (HOSPITAL_COMMUNITY): Payer: Medicare Other | Admitting: Certified Registered Nurse Anesthetist

## 2016-08-27 DIAGNOSIS — S7291XA Unspecified fracture of right femur, initial encounter for closed fracture: Secondary | ICD-10-CM

## 2016-08-27 DIAGNOSIS — M84750A Atypical femoral fracture, unspecified, initial encounter for fracture: Secondary | ICD-10-CM | POA: Diagnosis present

## 2016-08-27 DIAGNOSIS — S72001A Fracture of unspecified part of neck of right femur, initial encounter for closed fracture: Secondary | ICD-10-CM | POA: Diagnosis present

## 2016-08-27 HISTORY — PX: FEMUR IM NAIL: SHX1597

## 2016-08-27 LAB — CBC
HCT: 34.7 % — ABNORMAL LOW (ref 36.0–46.0)
HEMOGLOBIN: 11.4 g/dL — AB (ref 12.0–15.0)
MCH: 29.3 pg (ref 26.0–34.0)
MCHC: 32.9 g/dL (ref 30.0–36.0)
MCV: 89.2 fL (ref 78.0–100.0)
Platelets: 220 10*3/uL (ref 150–400)
RBC: 3.89 MIL/uL (ref 3.87–5.11)
RDW: 14.4 % (ref 11.5–15.5)
WBC: 13.6 10*3/uL — ABNORMAL HIGH (ref 4.0–10.5)

## 2016-08-27 LAB — ALBUMIN: ALBUMIN: 3.3 g/dL — AB (ref 3.5–5.0)

## 2016-08-27 SURGERY — INSERTION, INTRAMEDULLARY ROD, FEMUR
Anesthesia: General | Site: Hip | Laterality: Right

## 2016-08-27 MED ORDER — ENOXAPARIN SODIUM 40 MG/0.4ML ~~LOC~~ SOLN
40.0000 mg | SUBCUTANEOUS | Status: DC
  Administered 2016-08-28 – 2016-08-30 (×3): 40 mg via SUBCUTANEOUS
  Filled 2016-08-27 (×3): qty 0.4

## 2016-08-27 MED ORDER — CEFAZOLIN SODIUM-DEXTROSE 2-4 GM/100ML-% IV SOLN
2.0000 g | Freq: Four times a day (QID) | INTRAVENOUS | Status: AC
  Administered 2016-08-27 (×2): 2 g via INTRAVENOUS
  Filled 2016-08-27 (×2): qty 100

## 2016-08-27 MED ORDER — CEFAZOLIN SODIUM-DEXTROSE 2-4 GM/100ML-% IV SOLN
2.0000 g | INTRAVENOUS | Status: AC
  Administered 2016-08-27: 2 g via INTRAVENOUS

## 2016-08-27 MED ORDER — PHENOL 1.4 % MT LIQD: 1.0000 | OROMUCOSAL | Status: DC | PRN

## 2016-08-27 MED ORDER — ACETAMINOPHEN 650 MG RE SUPP: 650.0000 mg | Freq: Four times a day (QID) | RECTAL | Status: DC | PRN

## 2016-08-27 MED ORDER — ONDANSETRON HCL 4 MG/2ML IJ SOLN
4.0000 mg | Freq: Four times a day (QID) | INTRAMUSCULAR | Status: DC | PRN
  Administered 2016-08-27: 4 mg via INTRAVENOUS
  Filled 2016-08-27: qty 2

## 2016-08-27 MED ORDER — ONDANSETRON HCL 4 MG/2ML IJ SOLN
INTRAMUSCULAR | Status: DC | PRN
  Administered 2016-08-27: 4 mg via INTRAVENOUS

## 2016-08-27 MED ORDER — LIDOCAINE 2% (20 MG/ML) 5 ML SYRINGE
INTRAMUSCULAR | Status: AC
  Filled 2016-08-27: qty 5

## 2016-08-27 MED ORDER — ONDANSETRON HCL 4 MG/2ML IJ SOLN
INTRAMUSCULAR | Status: AC
  Filled 2016-08-27: qty 2

## 2016-08-27 MED ORDER — FENTANYL CITRATE (PF) 250 MCG/5ML IJ SOLN
INTRAMUSCULAR | Status: AC
  Filled 2016-08-27: qty 5

## 2016-08-27 MED ORDER — MORPHINE SULFATE (PF) 4 MG/ML IV SOLN
2.0000 mg | INTRAVENOUS | Status: AC | PRN
  Administered 2016-08-27 – 2016-08-28 (×2): 2 mg via INTRAVENOUS
  Filled 2016-08-27 (×2): qty 1

## 2016-08-27 MED ORDER — HYDROMORPHONE HCL 1 MG/ML IJ SOLN
INTRAMUSCULAR | Status: AC
  Filled 2016-08-27: qty 0.5

## 2016-08-27 MED ORDER — EPHEDRINE 5 MG/ML INJ
INTRAVENOUS | Status: AC
  Filled 2016-08-27: qty 10

## 2016-08-27 MED ORDER — SUCCINYLCHOLINE CHLORIDE 200 MG/10ML IV SOSY
PREFILLED_SYRINGE | INTRAVENOUS | Status: AC
  Filled 2016-08-27: qty 10

## 2016-08-27 MED ORDER — SODIUM CHLORIDE 0.9 % IR SOLN
Status: DC | PRN
  Administered 2016-08-27: 1000 mL

## 2016-08-27 MED ORDER — PROPOFOL 10 MG/ML IV BOLUS
INTRAVENOUS | Status: AC
  Filled 2016-08-27: qty 20

## 2016-08-27 MED ORDER — PROMETHAZINE HCL 25 MG/ML IJ SOLN: 6.2500 mg | INTRAMUSCULAR | Status: DC | PRN

## 2016-08-27 MED ORDER — MORPHINE SULFATE (PF) 4 MG/ML IV SOLN: 0.5000 mg | INTRAVENOUS | Status: DC | PRN

## 2016-08-27 MED ORDER — LACTATED RINGERS IV SOLN
INTRAVENOUS | Status: DC | PRN
  Administered 2016-08-27 (×2): via INTRAVENOUS

## 2016-08-27 MED ORDER — EPHEDRINE SULFATE 50 MG/ML IJ SOLN
INTRAMUSCULAR | Status: DC | PRN
  Administered 2016-08-27 (×3): 10 mg via INTRAVENOUS

## 2016-08-27 MED ORDER — METHOCARBAMOL 1000 MG/10ML IJ SOLN
500.0000 mg | Freq: Four times a day (QID) | INTRAMUSCULAR | Status: DC | PRN
  Filled 2016-08-27: qty 5

## 2016-08-27 MED ORDER — AMLODIPINE BESYLATE 10 MG PO TABS
10.0000 mg | ORAL_TABLET | Freq: Every day | ORAL | Status: DC
  Administered 2016-08-27 – 2016-08-29 (×3): 10 mg via ORAL
  Filled 2016-08-27 (×3): qty 1

## 2016-08-27 MED ORDER — DEXAMETHASONE SODIUM PHOSPHATE 10 MG/ML IJ SOLN
INTRAMUSCULAR | Status: DC | PRN
  Administered 2016-08-27: 5 mg via INTRAVENOUS

## 2016-08-27 MED ORDER — SUCCINYLCHOLINE CHLORIDE 200 MG/10ML IV SOSY
PREFILLED_SYRINGE | INTRAVENOUS | Status: DC | PRN
  Administered 2016-08-27: 80 mg via INTRAVENOUS

## 2016-08-27 MED ORDER — FENTANYL CITRATE (PF) 100 MCG/2ML IJ SOLN
INTRAMUSCULAR | Status: DC | PRN
  Administered 2016-08-27: 50 ug via INTRAVENOUS
  Administered 2016-08-27: 25 ug via INTRAVENOUS
  Administered 2016-08-27 (×2): 50 ug via INTRAVENOUS

## 2016-08-27 MED ORDER — HYDROCODONE-ACETAMINOPHEN 5-325 MG PO TABS: 1.0000 | ORAL_TABLET | Freq: Four times a day (QID) | ORAL | Status: DC | PRN

## 2016-08-27 MED ORDER — MENTHOL 3 MG MT LOZG
1.0000 | LOZENGE | OROMUCOSAL | Status: DC | PRN
Start: 2016-08-27 — End: 2016-08-30

## 2016-08-27 MED ORDER — PROPOFOL 10 MG/ML IV BOLUS
INTRAVENOUS | Status: DC | PRN
  Administered 2016-08-27: 80 mg via INTRAVENOUS

## 2016-08-27 MED ORDER — ISOPROPYL ALCOHOL 70 % SOLN
Status: DC | PRN
  Administered 2016-08-27: 1 via TOPICAL

## 2016-08-27 MED ORDER — BISACODYL 10 MG RE SUPP: 10.0000 mg | Freq: Every day | RECTAL | Status: DC | PRN

## 2016-08-27 MED ORDER — CEFAZOLIN SODIUM-DEXTROSE 2-4 GM/100ML-% IV SOLN
INTRAVENOUS | Status: AC
  Filled 2016-08-27: qty 100

## 2016-08-27 MED ORDER — METHOCARBAMOL 500 MG PO TABS
500.0000 mg | ORAL_TABLET | Freq: Four times a day (QID) | ORAL | Status: DC | PRN
  Administered 2016-08-28: 500 mg via ORAL
  Filled 2016-08-27: qty 1

## 2016-08-27 MED ORDER — LACTATED RINGERS IV SOLN: INTRAVENOUS | Status: DC

## 2016-08-27 MED ORDER — LIDOCAINE 2% (20 MG/ML) 5 ML SYRINGE
INTRAMUSCULAR | Status: DC | PRN
  Administered 2016-08-27: 60 mg via INTRAVENOUS

## 2016-08-27 MED ORDER — HYDROCODONE-ACETAMINOPHEN 5-325 MG PO TABS
1.0000 | ORAL_TABLET | Freq: Four times a day (QID) | ORAL | Status: DC | PRN
  Administered 2016-08-28 – 2016-08-30 (×6): 1 via ORAL
  Filled 2016-08-27 (×2): qty 1
  Filled 2016-08-27: qty 2
  Filled 2016-08-27 (×3): qty 1

## 2016-08-27 MED ORDER — ISOPROPYL ALCOHOL 70 % SOLN
Status: AC
  Filled 2016-08-27: qty 480

## 2016-08-27 MED ORDER — HYDROMORPHONE HCL 1 MG/ML IJ SOLN
0.2500 mg | INTRAMUSCULAR | Status: DC | PRN
  Administered 2016-08-27 (×2): 0.25 mg via INTRAVENOUS

## 2016-08-27 MED ORDER — SODIUM CHLORIDE 0.9 % IV SOLN
INTRAVENOUS | Status: AC
  Administered 2016-08-27: 14:00:00 via INTRAVENOUS

## 2016-08-27 MED ORDER — DEXAMETHASONE SODIUM PHOSPHATE 10 MG/ML IJ SOLN
INTRAMUSCULAR | Status: AC
  Filled 2016-08-27: qty 1

## 2016-08-27 MED ORDER — ORAL CARE MOUTH RINSE
15.0000 mL | Freq: Two times a day (BID) | OROMUCOSAL | Status: DC
  Administered 2016-08-27 – 2016-08-30 (×6): 15 mL via OROMUCOSAL

## 2016-08-27 MED ORDER — MOMETASONE FURO-FORMOTEROL FUM 100-5 MCG/ACT IN AERO
2.0000 | INHALATION_SPRAY | Freq: Two times a day (BID) | RESPIRATORY_TRACT | Status: DC
  Administered 2016-08-28 – 2016-08-30 (×5): 2 via RESPIRATORY_TRACT
  Filled 2016-08-27: qty 8.8

## 2016-08-27 MED ORDER — ACETAMINOPHEN 325 MG PO TABS: 650.0000 mg | ORAL_TABLET | Freq: Four times a day (QID) | ORAL | Status: DC | PRN

## 2016-08-27 SURGICAL SUPPLY — 42 items
ADH SKN CLS APL DERMABOND .7 (GAUZE/BANDAGES/DRESSINGS) ×1
BAG SPEC THK2 15X12 ZIP CLS (MISCELLANEOUS)
BAG ZIPLOCK 12X15 (MISCELLANEOUS) IMPLANT
BIT DRILL 4.3MMS DISTAL GRDTED (BIT) IMPLANT
CHLORAPREP W/TINT 26ML (MISCELLANEOUS) ×3 IMPLANT
COVER PERINEAL POST (MISCELLANEOUS) ×3 IMPLANT
DERMABOND ADVANCED (GAUZE/BANDAGES/DRESSINGS) ×2
DERMABOND ADVANCED .7 DNX12 (GAUZE/BANDAGES/DRESSINGS) ×2 IMPLANT
DRAPE C-ARM 42X120 X-RAY (DRAPES) ×3 IMPLANT
DRAPE C-ARMOR (DRAPES) ×3 IMPLANT
DRAPE ORTHO SPLIT 77X108 STRL (DRAPES) ×6
DRAPE STERI IOBAN 125X83 (DRAPES) ×3 IMPLANT
DRAPE SURG ORHT 6 SPLT 77X108 (DRAPES) IMPLANT
DRAPE U-SHAPE 47X51 STRL (DRAPES) ×6 IMPLANT
DRILL 4.3MMS DISTAL GRADUATED (BIT) ×3
DRSG AQUACEL AG ADV 3.5X 4 (GAUZE/BANDAGES/DRESSINGS) ×2 IMPLANT
DRSG AQUACEL AG ADV 3.5X 6 (GAUZE/BANDAGES/DRESSINGS) ×2 IMPLANT
DRSG MEPILEX BORDER 4X4 (GAUZE/BANDAGES/DRESSINGS) ×9 IMPLANT
ELECT BLADE TIP CTD 4 INCH (ELECTRODE) ×3 IMPLANT
FACESHIELD WRAPAROUND (MASK) ×3 IMPLANT
FACESHIELD WRAPAROUND OR TEAM (MASK) ×1 IMPLANT
GAUZE SPONGE 4X4 12PLY STRL (GAUZE/BANDAGES/DRESSINGS) ×3 IMPLANT
GLOVE BIO SURGEON STRL SZ8.5 (GLOVE) ×6 IMPLANT
GLOVE BIOGEL PI IND STRL 8.5 (GLOVE) ×1 IMPLANT
GLOVE BIOGEL PI INDICATOR 8.5 (GLOVE) ×2
GOWN SPEC L3 XXLG W/TWL (GOWN DISPOSABLE) ×6 IMPLANT
GUIDEPIN VERSANAIL DSP 3.2X444 (ORTHOPEDIC DISPOSABLE SUPPLIES) ×2 IMPLANT
GUIDEWIRE BALL NOSE 80CM (WIRE) ×2 IMPLANT
KIT BASIN OR (CUSTOM PROCEDURE TRAY) ×3 IMPLANT
MANIFOLD NEPTUNE II (INSTRUMENTS) ×3 IMPLANT
MARKER SKIN DUAL TIP RULER LAB (MISCELLANEOUS) ×3 IMPLANT
NAIL HIP FRACTURE 11X380MM (Nail) ×2 IMPLANT
PACK TOTAL JOINT (CUSTOM PROCEDURE TRAY) ×3 IMPLANT
SCREW BONE CORTICAL 5.0X42 (Screw) ×2 IMPLANT
SCREW LAG HIP NAIL 10.5X95 (Screw) ×2 IMPLANT
SUT MNCRL AB 3-0 PS2 18 (SUTURE) ×6 IMPLANT
SUT MON AB 2-0 CT1 36 (SUTURE) ×3 IMPLANT
SUT VIC AB 1 CT1 27 (SUTURE) ×6
SUT VIC AB 1 CT1 27XBRD ANTBC (SUTURE) ×2 IMPLANT
SUT VIC AB 2-0 CT1 27 (SUTURE) ×6
SUT VIC AB 2-0 CT1 27XBRD (SUTURE) ×2 IMPLANT
YANKAUER SUCT BULB TIP NO VENT (SUCTIONS) ×3 IMPLANT

## 2016-08-27 NOTE — Op Note (Signed)
OPERATIVE REPORT  SURGEON: Samson FredericBrian Suhaan Perleberg, MD   ASSISTANT: Alphonsa OverallBrad Dixon, PA-C.  PREOPERATIVE DIAGNOSIS: Right atypical femur fracture.   POSTOPERATIVE DIAGNOSIS: Right atypical femur fracture.   PROCEDURE: Intramedullary fixation, Right femur.   IMPLANTS: Biomet Affixus hip fracture nail 11 x 380 mm, 130. 10.5 x 95 mm lag screw. 5 x 42 mm distal interlocking screw 1.  ANESTHESIA:  General  ESTIMATED BLOOD LOSS: 100 mL.  ANTIBIOTICS: 2 g Ancef.  DRAINS: None.  COMPLICATIONS: None.   CONDITION: PACU - hemodynamically stable.Marland Kitchen.   BRIEF CLINICAL NOTE: Monica Caldwell is a 83 y.o. female with a history of osteoporosis who has been on alendronate for many years. She began experiencing pain in the right thigh at couple of months ago, which has been worsening. She stood up from a seated position yesterday, and she states that her leg gave way and she fell. She presented to the emergency department, and x-rays revealed an atypical proximal third right femur shaft fracture. The patient was admitted to the hospitalist service and underwent perioperative risk stratification and medical optimization. The risks, benefits, and alternatives to the procedure were explained, and the patient elected to proceed.  PROCEDURE IN DETAIL: Surgical site was marked by myself. The patient was taken to the operating room and anesthesia was induced on the bed. The [facility] was then [facility] and the nonoperative lower extremity was scissored underneath the operative side. The fracture was reduced with traction, internal rotation, and adduction. The hip was prepped and draped in the normal sterile surgical fashion. Timeout was called verifying side and site of surgery. Preop antibiotics were given with 60 minutes of beginning the procedure.  Fluoroscopy was used to define the patient's anatomy. A 4 cm incision was made just proximal to the tip of the greater trochanter. The awl was used to  obtain the standard starting point for a trochanteric entry nail under fluoroscopic control. The guidepin was placed. The entry reamer was used to open the proximal femur.  I used the reduction finger to reduce the femur fracture. I placed the guidewire to the level of the physeal scar of the knee. I measured the length of the guidewire. Sequential reaming was performed up to a size 12.5 mm with excellent chatter. Therefore, a size 11 by 380 mm nail was selected and assembled to the jig on the back table. The nail was placed without any difficulty. Through a separate stab incision, the cannula was placed down to the bone in preparation for the cephalomedullary device. A guidepin was placed into the femoral head using AP and lateral fluoroscopy views. The pin was measured, and then reaming was performed to the appropriate depth. The lag screw was inserted to the appropriate depth. The setscrew was tightened. Traction was released from the lower extremity. The fracture reduced very nicely. Using perfect circle technique, a distal interlocking screw was placed. The jig was removed. Final AP and lateral fluoroscopy views were obtained to confirm fracture reduction and hardware placement. Tip apex distance was appropriate. There was no chondral penetration.  The wounds were copiously irrigated with saline. The wound was closed in layers with #1 Vicryl for the fascia, 2-0 Monocryl for the deep dermal layer, and 3-0 Monocryl subcuticular stitch. Glue was applied to the skin. Once the glue was fully hardened, sterile dressing was applied. The patient was then awakened from anesthesia and taken to the PACU in stable condition. Sponge needle and instrument counts were correct at the end of  the case 2. There were no known complications.  We will readmit the patient to the hospitalist. The patient should discontinue bisphosphonates. Weightbearing status will be touchdown weightbearing with a walker. We will begin Lovenox  for DVT prophylaxis. The patient will work with physical therapy and undergo disposition planning.

## 2016-08-27 NOTE — Progress Notes (Signed)
Patient seen and examined, her vital is stable, she is currently on 2liter oxygen, no documented hypoxia, she denies chest pain, no sob, no cough, no abdominal pain, no diarrhea, no dysuria, no fever, she does has h/o asthma/copd, but no recent flares, not on home 02, she ambulate with a cane at baseline, she report walks daily without difficulty. No recent hospitalization, no recent medication changes, she has chronic bilateral lower extremity edema for which she takes lasix for the last three years, no recent dose changes, she follows with her primary care doctor yearly. cxr on admission with mild vascular congestion, she is not symptomatic, lung unremarkable on exam, she dose has trace pitting edema bilateral lower extremity on exam. She received iv potassium supplement in the ED due to npo status.    right leg pain is controlled by prn morphine in the ED, she is to transfer to [place] for surgery today.  her daughter is at bedside.

## 2016-08-27 NOTE — Discharge Instructions (Signed)
° °Dr. Emalie Mcwethy °Adult Hip & Knee Specialist °[facility] °[address] °[place], [place] [address] °[phone] ° ° °POSTOPERATIVE DIRECTIONS ° ° ° °Hip Rehabilitation, Guidelines Following Surgery  ° °WEIGHT BEARING °Partial weight bearing with assist device as directed.  Touchdown weightbearing. ° ° °HOME CARE INSTRUCTIONS  °Remove items at home which could result in a fall. This includes throw rugs or furniture in walking pathways.  °Continue medications as instructed at time of discharge. °· You may have some home medications which will be placed on hold until you complete the course of blood thinner medication. °· 4 days after discharge, you may start showering. No tub baths or soaking your incisions. °Do not put on socks or shoes without following the instructions of your caregivers.   °Sit on chairs with arms. Use the chair arms to help push yourself up when arising.  °Arrange for the use of a toilet seat elevator so you are not sitting low.  °· Walk with walker as instructed.  °You may resume a sexual relationship in one month or when given the OK by your caregiver.  °Use walker as long as suggested by your caregivers.  °Avoid periods of inactivity such as sitting longer than an hour when not asleep. This helps prevent blood clots.  °You may return to work once you are cleared by your surgeon.  °Do not drive a car for 6 weeks or until released by your surgeon.  °Do not drive while taking narcotics.  °Wear elastic stockings for two weeks following surgery during the day but you may remove then at night.  °Make sure you keep all of your appointments after your operation with all of your doctors and caregivers. You should call the office at the above phone number and make an appointment for approximately two weeks after the date of your surgery. °Please pick up a stool softener and laxative for home use as long as you are requiring pain medications. °· ICE to the affected hip  every three hours for 30 minutes at a time and then as needed for pain and swelling. Continue to use ice on the hip for pain and swelling from surgery. You may notice swelling that will progress down to the foot and ankle.  This is normal after surgery.  Elevate the leg when you are not up walking on it.   °It is important for you to complete the blood thinner medication as prescribed by your doctor. °· Continue to use the breathing machine which will help keep your temperature down.  It is common for your temperature to cycle up and down following surgery, especially at night when you are not up moving around and exerting yourself.  The breathing machine keeps your lungs expanded and your temperature down. ° °RANGE OF MOTION AND STRENGTHENING EXERCISES  °These exercises are designed to help you keep full movement of your hip joint. Follow your caregiver's or physical therapist's instructions. Perform all exercises about fifteen times, three times per day or as directed. Exercise both hips, even if you have had only one joint replacement. These exercises can be done on a training (exercise) mat, on the floor, on a table or on a bed. Use whatever works the best and is most comfortable for you. Use music or television while you are exercising so that the exercises are a pleasant break in your day. This will make your life better with the exercises acting as a break in routine you can look   to.  Lying on your back, slowly slide your foot toward your buttocks, raising your knee up off the floor. Then slowly slide your foot back down until your leg is straight again.  Lying on your back spread your legs as far apart as you can without causing discomfort.  Lying on your side, raise your upper leg and foot straight up from the floor as far as is comfortable. Slowly lower the leg and repeat.  Lying on your back, tighten up the muscle in the front of your thigh (quadriceps muscles). You can do this by keeping your  leg straight and trying to raise your heel off the floor. This helps strengthen the largest muscle supporting your knee.  Lying on your back, tighten up the muscles of your buttocks both with the legs straight and with the knee bent at a comfortable angle while keeping your heel on the floor.   SKILLED REHAB INSTRUCTIONS: If the patient is transferred to a skilled rehab facility following release from the hospital, a list of the current medications will be sent to the facility for the patient to continue.  When discharged from the skilled rehab facility, please have the facility set up the patient's Home Health Physical Therapy prior to being released. Also, the skilled facility will be responsible for providing the patient with their medications at time of release from the facility to include their pain medication and their blood thinner medication. If the patient is still at the rehab facility at time of the two week follow up appointment, the skilled rehab facility will also need to assist the patient in arranging follow up appointment in our office and any transportation needs.  MAKE SURE YOU:  Understand these instructions.  Will watch your condition.  Will get help right away if you are not doing well or get worse.  Pick up stool softner and laxative for home use following surgery while on pain medications. Daily dry dressing changes as needed. In 4 days, you may remove your dressings and begin taking showers - no tub baths or soaking the incisions. Continue to use ice for pain and swelling after surgery. Do not use any lotions or creams on the incision until instructed by your surgeon.

## 2016-08-27 NOTE — ED Notes (Signed)
Hospitalist at bedside 

## 2016-08-27 NOTE — Interval H&P Note (Signed)
History and Physical Interval Note:  08/27/2016 9:32 AM  Monica AreolaBarbara J Natzke  has presented today for surgery, with the diagnosis of right femur fracture  The various methods of treatment have been discussed with the patient and family. After consideration of risks, benefits and other options for treatment, the patient has consented to  Procedure(s): INTRAMEDULLARY (IM) NAIL FEMORAL (Right) as a surgical intervention .  The patient's history has been reviewed, patient examined, no change in status, stable for surgery.  I have reviewed the patient's chart and labs.  Questions were answered to the patient's satisfaction.    The risks, benefits, and alternatives were discussed with the patient. There are risks associated with the surgery including, but not limited to, problems with anesthesia (death), infection, differences in leg length/angulation/rotation, fracture of bones, loosening or failure of implants, malunion, nonunion, hematoma (blood accumulation) which may require surgical drainage, blood clots, pulmonary embolism, nerve injury (foot drop), and blood vessel injury. The patient understands these risks and elects to proceed.   Norlan Rann, Cloyde ReamsBrian James

## 2016-08-27 NOTE — Progress Notes (Signed)
SLP Cancellation Note  Patient Details Name: Monica Caldwell MRN: [id number] DOB: 06/12/1928   Cancelled treatment:       Reason Eval/Treat Not Completed: Patient not medically ready. Pt sleeping/ difficult to arouse following procedure. MD, please clarify need for bedside swallow eval order. Thank you.   Metro KungAmy K Willian Donson, MA, CCC-SLP 08/27/2016, 1:52 PM [phone]

## 2016-08-27 NOTE — Anesthesia Preprocedure Evaluation (Signed)
Anesthesia Evaluation  Patient identified by MRN, date of birth, ID band Patient awake    Reviewed: Allergy & Precautions, NPO status , Patient's Chart, lab work & pertinent test results  Airway Mallampati: II  TM Distance: >3 FB Neck ROM: Full    Dental no notable dental hx.    Pulmonary asthma ,    Pulmonary exam normal breath sounds clear to auscultation       Cardiovascular hypertension, Normal cardiovascular exam Rhythm:Regular Rate:Normal     Neuro/Psych negative neurological ROS  negative psych ROS   GI/Hepatic negative GI ROS, Neg liver ROS,   Endo/Other  negative endocrine ROS  Renal/GU negative Renal ROS  negative genitourinary   Musculoskeletal negative musculoskeletal ROS (+)   Abdominal   Peds negative pediatric ROS (+)  Hematology negative hematology ROS (+)   Anesthesia Other Findings   Reproductive/Obstetrics negative OB ROS                             Anesthesia Physical Anesthesia Plan  ASA: II  Anesthesia Plan: General   Post-op Pain Management:    Induction: Intravenous  Airway Management Planned: Oral ETT  Additional Equipment:   Intra-op Plan:   Post-operative Plan: Extubation in OR  Informed Consent: I have reviewed the patients History and Physical, chart, labs and discussed the procedure including the risks, benefits and alternatives for the proposed anesthesia with the patient or authorized representative who has indicated his/her understanding and acceptance.   Dental advisory given  Plan Discussed with: CRNA and Surgeon  Anesthesia Plan Comments:         Anesthesia Quick Evaluation

## 2016-08-27 NOTE — Transfer of Care (Signed)
Immediate Anesthesia Transfer of Care Note  Patient: Monica AreolaBarbara J Christopherson  Procedure(s) Performed: Procedure(s): INTRAMEDULLARY (IM) NAIL FEMORAL (Right)  Patient Location: PACU  Anesthesia Type:General  Level of Consciousness:  sedated, patient cooperative and responds to stimulation  Airway & Oxygen Therapy:Patient Spontanous Breathing and Patient connected to face mask oxgen  Post-op Assessment:  Report given to PACU RN and Post -op Vital signs reviewed and stable  Post vital signs:  Reviewed and stable  Last Vitals:  Vitals:   08/27/16 0858 08/27/16 0900  BP: (!) 145/76 134/84  Pulse: 84 88  Resp: (!) 23 20  Temp:      Complications: No apparent anesthesia complications

## 2016-08-27 NOTE — ED Notes (Signed)
This Clinical research associatewriter on phone with [facility] for transport  [facility]. While on phone, this Clinical research associatewriter received call from [facility], Angie, verbalizing defer transfer at present time and wait time may exceed 2 hours; [facility] when ready for transfer/transport. Xu made aware.

## 2016-08-27 NOTE — ED Notes (Signed)
At shift change pt noted to be on 2 lpm [place]. Pt verbalizes not usually on oxygen. With VS assessment and oxygen turned off, oxygen saturation visualized at 92%. 2 lpm [place] reapplied. Pt assisted with female urinal per request.

## 2016-08-27 NOTE — Anesthesia Postprocedure Evaluation (Signed)
Anesthesia Post Note  Patient: Minerva AreolaBarbara J Staley  Procedure(s) Performed: Procedure(s) (LRB): INTRAMEDULLARY (IM) NAIL FEMORAL (Right)  Patient location during evaluation: PACU Anesthesia Type: General Level of consciousness: awake and alert Pain management: pain level controlled Vital Signs Assessment: post-procedure vital signs reviewed and stable Respiratory status: spontaneous breathing, nonlabored ventilation, respiratory function stable and patient connected to nasal cannula oxygen Cardiovascular status: blood pressure returned to baseline and stable Postop Assessment: no signs of nausea or vomiting Anesthetic complications: no       Last Vitals:  Vitals:   08/27/16 1345 08/27/16 1440  BP: 135/66 (!) 115/55  Pulse: 77 82  Resp: 14 13  Temp: 36.7 C 36.6 C    Last Pain:  Vitals:   08/27/16 1440  TempSrc: Oral  PainSc:                  Mauri Temkin S

## 2016-08-27 NOTE — ED Notes (Signed)
[facility], Monica Caldwell, called verbalizing [facility]. Xu made aware.

## 2016-08-27 NOTE — Anesthesia Procedure Notes (Signed)
Procedure Name: Intubation Date/Time: 08/27/2016 10:02 AM Performed by: West Pugh Pre-anesthesia Checklist: Patient identified, Emergency Drugs available, Suction available, Patient being monitored and Timeout performed Patient Re-evaluated:Patient Re-evaluated prior to inductionOxygen Delivery Method: Circle system utilized Preoxygenation: Pre-oxygenation with 100% oxygen Intubation Type: IV induction and Cricoid Pressure applied Ventilation: Mask ventilation without difficulty Laryngoscope Size: Mac and 3 Grade View: Grade I Tube type: Oral Tube size: 7.0 mm Number of attempts: 1 Airway Equipment and Method: Stylet Placement Confirmation: ETT inserted through vocal cords under direct vision,  positive ETCO2,  CO2 detector and breath sounds checked- equal and bilateral Secured at: 20 cm Tube secured with: Tape Dental Injury: Teeth and Oropharynx as per pre-operative assessment

## 2016-08-28 ENCOUNTER — Other Ambulatory Visit (HOSPITAL_COMMUNITY): Payer: Medicare Other

## 2016-08-28 LAB — BASIC METABOLIC PANEL
Anion gap: 4 — ABNORMAL LOW (ref 5–15)
BUN: 19 mg/dL (ref 6–20)
CHLORIDE: 109 mmol/L (ref 101–111)
CO2: 28 mmol/L (ref 22–32)
CREATININE: 0.98 mg/dL (ref 0.44–1.00)
Calcium: 7.9 mg/dL — ABNORMAL LOW (ref 8.9–10.3)
GFR calc Af Amer: 59 mL/min — ABNORMAL LOW (ref >60)
GFR calc non Af Amer: 51 mL/min — ABNORMAL LOW (ref >60)
Glucose, Bld: 113 mg/dL — ABNORMAL HIGH (ref 65–99)
Potassium: 3.7 mmol/L (ref 3.5–5.1)
SODIUM: 141 mmol/L (ref 135–145)

## 2016-08-28 LAB — MAGNESIUM: MAGNESIUM: 1.9 mg/dL (ref 1.7–2.4)

## 2016-08-28 LAB — CBC
HCT: 30.6 % — ABNORMAL LOW (ref 36.0–46.0)
Hemoglobin: 10 g/dL — ABNORMAL LOW (ref 12.0–15.0)
MCH: 29.3 pg (ref 26.0–34.0)
MCHC: 32.7 g/dL (ref 30.0–36.0)
MCV: 89.7 fL (ref 78.0–100.0)
PLATELETS: 216 10*3/uL (ref 150–400)
RBC: 3.41 MIL/uL — ABNORMAL LOW (ref 3.87–5.11)
RDW: 14.7 % (ref 11.5–15.5)
WBC: 10.7 10*3/uL — ABNORMAL HIGH (ref 4.0–10.5)

## 2016-08-28 LAB — HEPATIC FUNCTION PANEL
ALT: 22 U/L (ref 14–54)
AST: 36 U/L (ref 15–41)
Albumin: 2.8 g/dL — ABNORMAL LOW (ref 3.5–5.0)
Alkaline Phosphatase: 36 U/L — ABNORMAL LOW (ref 38–126)
Total Bilirubin: 0.6 mg/dL (ref 0.3–1.2)
Total Protein: 5.2 g/dL — ABNORMAL LOW (ref 6.5–8.1)

## 2016-08-28 MED ORDER — ALBUTEROL SULFATE (2.5 MG/3ML) 0.083% IN NEBU: 2.5000 mg | INHALATION_SOLUTION | RESPIRATORY_TRACT | Status: DC | PRN

## 2016-08-28 NOTE — Progress Notes (Signed)
   Subjective: 1 Day Post-Op Procedure(s) (LRB): INTRAMEDULLARY (IM) NAIL FEMORAL (Right)  Right hip and thigh doing fairly well this morning Pain is mild currently Denies any new symptoms or issues  Patient reports pain as mild.  Objective:   VITALS:   Vitals:   08/28/16 0201 08/28/16 0549  BP: (!) 113/53 (!) 111/51  Pulse: 85 84  Resp: 15 16  Temp: 98.4 F (36.9 C) 98.4 F (36.9 C)    Right hip and leg incisions healing well No drainage or erythema nv intact distally  LABS  Recent Labs  08/26/16 1843 08/27/16 1421 08/28/16 0457  HGB 12.2 11.4* 10.0*  HCT 37.0 34.7* 30.6*  WBC 9.1 13.6* 10.7*  PLT 258 220 216     Recent Labs  08/26/16 1843 08/28/16 0457  NA 142 141  K 3.1* 3.7  BUN 22* 19  CREATININE 1.02* 0.98  GLUCOSE 143* 113*     Assessment/Plan: 1 Day Post-Op Procedure(s) (LRB): INTRAMEDULLARY (IM) NAIL FEMORAL (Right) PT/OT  Pain management D/c planning to probable SNF    Brad Samar Venneman, MPAS, PA-C  08/28/2016, 8:17 AM

## 2016-08-28 NOTE — Evaluation (Signed)
Physical Therapy Evaluation Patient Details Name: Monica AreolaBarbara J Mihok MRN: [id number] DOB: 07/08/1928 Today's Date: 08/28/2016   History of Present Illness  83 yo female s/p IM nail R femoral shaft 08/27/16. Hx of osteoporosis.  Clinical Impression  On eval, pt required Max assist +2 for mobility. She sat EOB ~5 minutes with Min guard assist. Pain rated 7/10. Will plan to attempt standing or transfer on next session, if appropriate. Recommend SNF for ST rehab.     Follow Up Recommendations SNF    Equipment Recommendations   (TBD at next venue)    Recommendations for Other Services       Precautions / Restrictions Precautions Precautions: Fall Restrictions Weight Bearing Restrictions: Yes RLE Weight Bearing: Partial weight bearing RLE Partial Weight Bearing Percentage or Pounds: 30      Mobility  Bed Mobility Overal bed mobility: Needs Assistance Bed Mobility: Supine to Sit;Sit to Supine     Supine to sit: Max assist;+2 for physical assistance;+2 for safety/equipment;HOB elevated Sit to supine: Max assist;+2 for physical assistance;+2 for safety/equipment;HOB elevated   General bed mobility comments: Assist for trunk and bil LEs. Utilized bedpad for scooting, positioning. Increased time. VCs technique.   Transfers Overall transfer level: Needs assistance   Transfers: Lateral/Scoot Transfers          Lateral/Scoot Transfers: Mod assist;+2 physical assistance General transfer comment: Scoot towards HOB. Utilized pad for scooting.   Ambulation/Gait                Stairs            Wheelchair Mobility    Modified Rankin (Stroke Patients Only)       Balance Overall balance assessment: Needs assistance Sitting-balance support: Bilateral upper extremity supported;Feet supported Sitting balance-Leahy Scale: Fair Sitting balance - Comments: Sat EOB ~ 5 mintues with Min guard assist.      Standing balance-Leahy Scale: Poor                                Pertinent Vitals/Pain Pain Assessment: 0-10 Pain Score: 7  Pain Location: R LE with activity Pain Descriptors / Indicators: Aching;Sore Pain Intervention(s): Limited activity within patient's tolerance;Repositioned    Home Living Family/patient expects to be discharged to:: Skilled nursing facility Living Arrangements: Children                    Prior Function Level of Independence: Independent with assistive device(s)         Comments: cane for ambulation     Hand Dominance        Extremity/Trunk Assessment   Upper Extremity Assessment Upper Extremity Assessment: Defer to OT evaluation    Lower Extremity Assessment Lower Extremity Assessment: Generalized weakness (s/p R IM nail)    Cervical / Trunk Assessment Cervical / Trunk Assessment: Normal  Communication   Communication: No difficulties  Cognition Arousal/Alertness: Awake/alert Behavior During Therapy: WFL for tasks assessed/performed Overall Cognitive Status: Within Functional Limits for tasks assessed                                        General Comments      Exercises     Assessment/Plan    PT Assessment Patient needs continued PT services  PT Problem List Decreased strength;Decreased mobility;Decreased balance;Pain;Decreased knowledge of use of DME;Decreased activity tolerance;Decreased range  of motion;Decreased knowledge of precautions       PT Treatment Interventions DME instruction;Therapeutic activities;Gait training;Therapeutic exercise;Functional mobility training;Balance training;Patient/family education    PT Goals (Current goals can be found in the Care Plan section)  Acute Rehab PT Goals Patient Stated Goal: less pain. regain independence PT Goal Formulation: With patient Time For Goal Achievement: 09/11/16 Potential to Achieve Goals: Good    Frequency Min 3X/week   Barriers to discharge        Co-evaluation                End of Session   Activity Tolerance: Patient limited by fatigue;Patient limited by pain Patient left: in bed;with call bell/phone within reach;with bed alarm set   PT Visit Diagnosis: Muscle weakness (generalized) (M62.81);Difficulty in walking, not elsewhere classified (R26.2)    Time: 1610-9604 PT Time Calculation (min) (ACUTE ONLY): 9 min   Charges:   PT Evaluation $PT Eval Moderate Complexity: 1 Procedure     PT G Codes:          Rebeca Alert, MPT Pager: [phone]

## 2016-08-28 NOTE — Progress Notes (Signed)
PROGRESS NOTE  Monica Caldwell ION:[id number] DOB: 04/25/1929 DOA: 08/26/2016 PCP: Pcp Not In System  HPI/Recap of past 24 hours:  POD1, pain is well controlled, no fever, no sob, no chest pain  Assessment/Plan: Principal Problem:   Closed fracture of right femur, unspecified fracture morphology, initial encounter [facility]) Active Problems:   Hypokalemia   Hypertension   Asthma   Femur fracture, right (HCC)   Cardiomegaly   Heart murmur   Closed right hip fracture (HCC)   Atypical fracture of femur   Closed fracture of right femur, unspecified fracture morphology, initial encounter (HCC) -  Right atypical femur fracture, s/p Intramedullary fixation, Right femur. On 3/24 Management per ortho   h/o osteoporosis: on vitamin /calcium and fosamax  Hypertension : stable on home med norvasc. On lasix and maxzide at home? Diuretic held since admission, will need to clarify with patient  Cardiomegaly and heart murmur, echogram pending, euvolemic on exam  Hypokalemia -  replaced, magnesium 1.9  Asthma -stable, no wheezing,   continue home medication advair /   DVT prophylaxis:  per ortho  Code Status:    DNR/DNI   as per patient     Family Communication:   patient   Disposition Plan: SNF , hopefully on 3/26    Consultants:  ortho  Procedures: Intramedullary fixation, Right femur. For Right atypical femur fracture by Dr Linna Caprice on 3/24  IMPLANTS: Biomet Affixus hip fracture nail 11 x 380 mm, 130. 10.5 x 95 mm lag screw. 5 x 42 mm distal interlocking screw 1.  Antibiotics:  Perioperative cefazolin   Objective: BP (!) 135/49 (BP Location: Right Arm)   Pulse 91   Temp 98.2 F (36.8 C) (Oral)   Resp 20   Ht 5\' 4"  (1.626 m)   Wt 74.8 kg (165 lb)   SpO2 98%   BMI 28.32 kg/m   Intake/Output Summary (Last 24 hours) at 08/28/16 1732 Last data filed at 08/28/16 2440  Gross per 24 hour  Intake             1005 ml  Output              300 ml  Net               705 ml   Filed Weights   08/26/16 1824  Weight: 74.8 kg (165 lb)    Exam:   General:  NAD  Cardiovascular: RRR  Respiratory: CTABL  Abdomen: Soft/ND/NT, positive BS  Musculoskeletal: No Edema  Neuro:   Data Reviewed: Basic Metabolic Panel:  Recent Labs Lab 08/26/16 1843 08/28/16 0457  NA 142 141  K 3.1* 3.7  CL 107 109  CO2 28 28  GLUCOSE 143* 113*  BUN 22* 19  CREATININE 1.02* 0.98  CALCIUM 8.9 7.9*  MG 2.0 1.9   Liver Function Tests:  Recent Labs Lab 08/27/16 1421 08/28/16 0457  AST  --  36  ALT  --  22  ALKPHOS  --  36*  BILITOT  --  0.6  PROT  --  5.2*  ALBUMIN 3.3* 2.8*   No results for input(s): LIPASE, AMYLASE in the last 168 hours. No results for input(s): AMMONIA in the last 168 hours. CBC:  Recent Labs Lab 08/26/16 1843 08/27/16 1421 08/28/16 0457  WBC 9.1 13.6* 10.7*  NEUTROABS 5.8  --   --   HGB 12.2 11.4* 10.0*  HCT 37.0 34.7* 30.6*  MCV 88.5 89.2 89.7  PLT 258 220 216  Cardiac Enzymes:   No results for input(s): CKTOTAL, CKMB, CKMBINDEX, TROPONINI in the last 168 hours. BNP (last 3 results) No results for input(s): BNP in the last 8760 hours.  ProBNP (last 3 results) No results for input(s): PROBNP in the last 8760 hours.  CBG: No results for input(s): GLUCAP in the last 168 hours.  No results found for this or any previous visit (from the past 240 hour(s)).   Studies: No results found.  Scheduled Meds: . amLODipine  10 mg Oral Daily  . enoxaparin (LOVENOX) injection  40 mg Subcutaneous Q24H  . mouth rinse  15 mL Mouth Rinse BID  . mometasone-formoterol  2 puff Inhalation BID  . potassium chloride  40 mEq Oral Once    Continuous Infusions:   Time spent: 25mins  Brenyn Petrey MD, PhD  [facility] Pager [phone]. If 7PM-7AM, please contact night-coverage at [email or web address], password [email or web address] 08/28/2016, 5:32 PM  LOS: 2 days

## 2016-08-29 ENCOUNTER — Inpatient Hospital Stay (HOSPITAL_COMMUNITY): Payer: Medicare Other

## 2016-08-29 ENCOUNTER — Encounter (HOSPITAL_COMMUNITY): Payer: Self-pay | Admitting: Orthopedic Surgery

## 2016-08-29 DIAGNOSIS — R011 Cardiac murmur, unspecified: Secondary | ICD-10-CM

## 2016-08-29 LAB — BASIC METABOLIC PANEL
Anion gap: 4 — ABNORMAL LOW (ref 5–15)
BUN: 17 mg/dL (ref 6–20)
CALCIUM: 8.1 mg/dL — AB (ref 8.9–10.3)
CHLORIDE: 107 mmol/L (ref 101–111)
CO2: 30 mmol/L (ref 22–32)
CREATININE: 0.75 mg/dL (ref 0.44–1.00)
GFR calc non Af Amer: >60 mL/min (ref >60)
Glucose, Bld: 101 mg/dL — ABNORMAL HIGH (ref 65–99)
Potassium: 3.6 mmol/L (ref 3.5–5.1)
SODIUM: 141 mmol/L (ref 135–145)

## 2016-08-29 LAB — CBC
HCT: 32.8 % — ABNORMAL LOW (ref 36.0–46.0)
Hemoglobin: 10.7 g/dL — ABNORMAL LOW (ref 12.0–15.0)
MCH: 29.6 pg (ref 26.0–34.0)
MCHC: 32.6 g/dL (ref 30.0–36.0)
MCV: 90.9 fL (ref 78.0–100.0)
Platelets: 204 10*3/uL (ref 150–400)
RBC: 3.61 MIL/uL — ABNORMAL LOW (ref 3.87–5.11)
RDW: 14.7 % (ref 11.5–15.5)
WBC: 9.2 10*3/uL (ref 4.0–10.5)

## 2016-08-29 LAB — VITAMIN D 25 HYDROXY (VIT D DEFICIENCY, FRACTURES): Vit D, 25-Hydroxy: 40.2 ng/mL (ref 30.0–100.0)

## 2016-08-29 LAB — ECHOCARDIOGRAM COMPLETE
Height: 64 in
Weight: 2640 oz

## 2016-08-29 MED ORDER — SENNA-DOCUSATE SODIUM 8.6-50 MG PO TABS: 1.0000 | ORAL_TABLET | Freq: Every day | ORAL | 0 refills | Status: DC

## 2016-08-29 MED ORDER — HYDROCODONE-ACETAMINOPHEN 5-325 MG PO TABS: 1.0000 | ORAL_TABLET | Freq: Four times a day (QID) | ORAL | 0 refills | Status: DC | PRN

## 2016-08-29 MED ORDER — ENOXAPARIN SODIUM 40 MG/0.4ML ~~LOC~~ SOLN: 40.0000 mg | SUBCUTANEOUS | 0 refills | Status: DC

## 2016-08-29 MED ORDER — POTASSIUM CHLORIDE CRYS ER 20 MEQ PO TBCR: 20.0000 meq | EXTENDED_RELEASE_TABLET | ORAL | 0 refills | Status: DC

## 2016-08-29 MED ORDER — METOPROLOL TARTRATE 25 MG PO TABS
12.5000 mg | ORAL_TABLET | Freq: Two times a day (BID) | ORAL | Status: DC
  Administered 2016-08-29 – 2016-08-30 (×2): 12.5 mg via ORAL
  Filled 2016-08-29 (×2): qty 1

## 2016-08-29 MED ORDER — BISACODYL 10 MG RE SUPP: 10.0000 mg | Freq: Every day | RECTAL | 0 refills | Status: AC

## 2016-08-29 MED ORDER — PERFLUTREN LIPID MICROSPHERE
INTRAVENOUS | Status: AC
  Filled 2016-08-29: qty 10

## 2016-08-29 MED ORDER — POTASSIUM CHLORIDE CRYS ER 20 MEQ PO TBCR
40.0000 meq | EXTENDED_RELEASE_TABLET | Freq: Once | ORAL | Status: AC
  Administered 2016-08-29: 40 meq via ORAL
  Filled 2016-08-29: qty 2

## 2016-08-29 MED ORDER — PERFLUTREN LIPID MICROSPHERE
1.0000 mL | INTRAVENOUS | Status: AC | PRN
  Administered 2016-08-29: 2 mL via INTRAVENOUS
  Filled 2016-08-29: qty 10

## 2016-08-29 MED ORDER — FUROSEMIDE 10 MG/ML IJ SOLN
40.0000 mg | Freq: Once | INTRAMUSCULAR | Status: AC
  Administered 2016-08-29: 40 mg via INTRAVENOUS
  Filled 2016-08-29: qty 4

## 2016-08-29 NOTE — Progress Notes (Signed)
   Subjective:  Patient reports pain as mild to moderate.  No c/o.  Objective:   VITALS:   Vitals:   08/29/16 0900 08/29/16 0915 08/29/16 0927 08/29/16 1457  BP:    (!) 133/56  Pulse:    96  Resp:    12  Temp:    98.4 F (36.9 C)  TempSrc:    Oral  SpO2: (!) 89% 97% 94% 93%  Weight:      Height:        NAD ABD soft Sensation intact distally Intact pulses distally Dorsiflexion/Plantar flexion intact Incision: dressing C/D/I Compartment soft   Lab Results  Component Value Date   WBC 9.2 08/29/2016   HGB 10.7 (L) 08/29/2016   HCT 32.8 (L) 08/29/2016   MCV 90.9 08/29/2016   PLT 204 08/29/2016   BMET    Component Value Date/Time   NA 141 08/29/2016 0535   K 3.6 08/29/2016 0535   CL 107 08/29/2016 0535   CO2 30 08/29/2016 0535   GLUCOSE 101 (H) 08/29/2016 0535   BUN 17 08/29/2016 0535   CREATININE 0.75 08/29/2016 0535   CALCIUM 8.1 (L) 08/29/2016 0535   GFRNONAA >60 08/29/2016 0535   GFRAA >60 08/29/2016 0535     Assessment/Plan: 2 Days Post-Op   Principal Problem:   Closed fracture of right femur, unspecified fracture morphology, initial encounter (HCC) Active Problems:   Hypokalemia   Hypertension   Asthma   Femur fracture, right (HCC)   Cardiomegaly   Heart murmur   Closed right hip fracture (HCC)   Atypical fracture of femur   TDWB RLE with walker DVT ppx: lovenox x30 days, SCDs, TEDs PT/OT Dispo: [facility]   [place]co Marchitto, Cloyde ReamsBrian James 08/29/2016, 4:44 PM   Samson FredericBrian Luismario Coston, MD Cell [phone]

## 2016-08-29 NOTE — Progress Notes (Signed)
PROGRESS NOTE  ERNESTYNE CALDWELL ZHY:[id number] DOB: [birth year] DOA: 08/26/2016 PCP: Pcp Not In System  HPI/Recap of past 24 hours:  POD2, pain is well controlled, no fever, no sob, no chest pain  Assessment/Plan: Principal Problem:   Closed fracture of right femur, unspecified fracture morphology, initial encounter [facility]) Active Problems:   Hypokalemia   Hypertension   Asthma   Femur fracture, right (HCC)   Cardiomegaly   Heart murmur   Closed right hip fracture (HCC)   Atypical fracture of femur   Closed fracture of right femur, unspecified fracture morphology, initial encounter (HCC) -  Right atypical femur fracture, s/p Intramedullary fixation, Right femur. On 3/24 Management per ortho   h/o osteoporosis: on vitamin /calcium   Hypertension :   Home Diuretic held since admission, once dose of iv lasix given on 3/26 due to some oxygen requirement, she denies sob, no chest pain. D/c norvasc, trial of betablocker  Cardiomegaly and heart murmur, echo with adequate EF, grade I diastolic dysfunction, dynamic obstruction at rest , euvolemic on exam start lopressor for dynamic obstruction at rest  Hypokalemia -  replaced, magnesium 1.9  Asthma -stable, no wheezing,   continue home medication advair    DVT prophylaxis:  per ortho  Code Status:    DNR/DNI   as per patient     Family Communication:   patient   Disposition Plan: SNF , hopefully on 3/26    Consultants:  ortho  Procedures: Intramedullary fixation, Right femur. For Right atypical femur fracture by Dr Linna Caprice on 3/24  IMPLANTS: Bi[employer] Affixus hip fracture nail 11 x 380 mm, 130. 10.5 x 95 mm lag screw. 5 x 42 mm distal interlocking screw 1.  Antibiotics:  Perioperative cefazolin   Objective: BP (!) 133/56 (BP Location: Right Arm)   Pulse 96   Temp 98.4 F (36.9 C) (Oral)   Resp 12   Ht 5\' 4"  (1.626 m)   Wt 74.8 kg (165 lb)   SpO2 93%   BMI 28.32 kg/m   Intake/Output Summary  (Last 24 hours) at 08/29/16 1936 Last data filed at 08/29/16 1900  Gross per 24 hour  Intake              320 ml  Output             2100 ml  Net            -1780 ml   Filed Weights   08/26/16 1824  Weight: 74.8 kg (165 lb)    Exam:   General:  NAD  Cardiovascular: RRR  Respiratory: CTABL  Abdomen: Soft/ND/NT, positive BS  Musculoskeletal: post op changes  Neuro: aaox3  Data Reviewed: Basic Metabolic Panel:  Recent Labs Lab 08/26/16 1843 08/28/16 0457 08/29/16 0535  NA 142 141 141  K 3.1* 3.7 3.6  CL 107 109 107  CO2 28 28 30   GLUCOSE 143* 113* 101*  BUN 22* 19 17  CREATININE 1.02* 0.98 0.75  CALCIUM 8.9 7.9* 8.1*  MG 2.0 1.9  --    Liver Function Tests:  Recent Labs Lab 08/27/16 1421 08/28/16 0457  AST  --  36  ALT  --  22  ALKPHOS  --  36*  BILITOT  --  0.6  PROT  --  5.2*  ALBUMIN 3.3* 2.8*   No results for input(s): LIPASE, AMYLASE in the last 168 hours. No results for input(s): AMMONIA in the last 168 hours. CBC:  Recent Labs Lab 08/26/16 1843 08/27/16  1421 08/28/16 0457 08/29/16 0535  WBC 9.1 13.6* 10.7* 9.2  NEUTROABS 5.8  --   --   --   HGB 12.2 11.4* 10.0* 10.7*  HCT 37.0 34.7* 30.6* 32.8*  MCV 88.5 89.2 89.7 90.9  PLT 258 220 216 204   Cardiac Enzymes:   No results for input(s): CKTOTAL, CKMB, CKMBINDEX, TROPONINI in the last 168 hours. BNP (last 3 results) No results for input(s): BNP in the last 8760 hours.  ProBNP (last 3 results) No results for input(s): PROBNP in the last 8760 hours.  CBG: No results for input(s): GLUCAP in the last 168 hours.  No results found for this or any previous visit (from the past 240 hour(s)).   Studies: No results found.  Scheduled Meds: . enoxaparin (LOVENOX) injection  40 mg Subcutaneous Q24H  . mouth rinse  15 mL Mouth Rinse BID  . metoprolol tartrate  12.5 mg Oral BID  . mometasone-formoterol  2 puff Inhalation BID  . potassium chloride  40 mEq Oral Once    Continuous  Infusions:   Time spent: 25mins  Breiona Couvillon MD, PhD  [facility] Pager [phone]. If 7PM-7AM, please contact night-coverage at [email or web address], password [email or web address] 08/29/2016, 7:36 PM  LOS: 3 days

## 2016-08-29 NOTE — Progress Notes (Signed)
OT Cancellation Note  Patient Details Name: Monica Caldwell MRN: [id number] DOB: 08/25/1928   Cancelled Treatment:    Reason Eval/Treat Not Completed: Other (comment)  Noted plan is SNF- [facility] [place]rizar, [place] [phone]  Einar CrowEDDING, Khya Halls D 08/29/2016, 1:13 PM

## 2016-08-29 NOTE — NC FL2 (Signed)
[place] MEDICAID FL2 LEVEL OF CARE SCREENING TOOL     IDENTIFICATION  Patient Name: Monica Caldwell Birthdate: 29-Mar-1929 Sex: female Admission Date (Current Location): 08/26/2016  [facility] and [place] Number:  Producer, television/film/video and Address:  [facility],  [address] [place] [address]      Provider Number: [id number]  Attending Physician Name and Address:  Albertine Grates, MD  Relative Name and Phone Number:       Current Level of Care: Hospital Recommended Level of Care: Skilled Nursing Facility Prior Approval Number:    Date Approved/Denied:   PASRR Number: [id number]  Discharge Plan: SNF    Current Diagnoses: Patient Active Problem List   Diagnosis Date Noted  . Closed right hip fracture (HCC) 08/27/2016  . Atypical fracture of femur 08/27/2016  . Hypokalemia 08/26/2016  . Closed fracture of right femur, unspecified fracture morphology, initial encounter (HCC) 08/26/2016  . Hypertension 08/26/2016  . Asthma 08/26/2016  . Femur fracture, right (HCC) 08/26/2016  . Cardiomegaly 08/26/2016  . Heart murmur 08/26/2016  . Closed fracture of right distal radius and ulna 01/11/2013    Orientation RESPIRATION BLADDER Height & Weight     Self, Time, Situation, Place  O2 (Nasal Cannula ) Continent Weight: 165 lb (74.8 kg) Height:  5\' 4"  (162.6 cm)  BEHAVIORAL SYMPTOMS/MOOD NEUROLOGICAL BOWEL NUTRITION STATUS        Diet (Heart Healthy)  AMBULATORY STATUS COMMUNICATION OF NEEDS Skin   Extensive Assist Verbally                         Personal Care Assistance Level of Assistance  Bathing, Feeding, Dressing Bathing Assistance: Limited assistance Feeding assistance: Independent Dressing Assistance: Limited assistance     Functional Limitations Info             SPECIAL CARE FACTORS FREQUENCY                       Contractures Contractures Info: Not present    Additional Factors Info  Code Status, Allergies Code Status Info:  Full Code Allergies Info: NKA           Current Medications (08/29/2016):  This is the current hospital active medication list Current Facility-Administered Medications  Medication Dose Route Frequency Provider Last Rate Last Dose  . acetaminophen (TYLENOL) tablet 650 mg  650 mg Oral Q6H PRN Samson Frederic, MD       Or  . acetaminophen (TYLENOL) suppository 650 mg  650 mg Rectal Q6H PRN Samson Frederic, MD      . albuterol (PROVENTIL) (2.5 MG/3ML) 0.083% nebulizer solution 2.5 mg  2.5 mg Nebulization Q2H PRN Therisa Doyne, MD      . amLODipine (NORVASC) tablet 10 mg  10 mg Oral Daily Therisa Doyne, MD   10 mg at 08/29/16 0901  . bisacodyl (DULCOLAX) suppository 10 mg  10 mg Rectal Daily PRN Therisa Doyne, MD      . enoxaparin (LOVENOX) injection 40 mg  40 mg Subcutaneous Q24H Samson Frederic, MD   40 mg at 08/29/16 0900  . HYDROcodone-acetaminophen (NORCO/VICODIN) 5-325 MG per tablet 1-2 tablet  1-2 tablet Oral Q6H PRN Samson Frederic, MD   1 tablet at 08/29/16 1036  . MEDLINE mouth rinse  15 mL Mouth Rinse BID Albertine Grates, MD   15 mL at 08/29/16 0901  . menthol-cetylpyridinium (CEPACOL) lozenge 3 mg  1 lozenge Oral PRN Samson Frederic, MD  Or  . phenol (CHLORASEPTIC) mouth spray 1 spray  1 spray Mouth/Throat PRN Samson FredericBrian Swinteck, MD      . methocarbamol (ROBAXIN) tablet 500 mg  500 mg Oral Q6H PRN Therisa DoyneAnastassia Doutova, MD   500 mg at 08/28/16 2212   Or  . methocarbamol (ROBAXIN) 500 mg in dextrose 5 % 50 mL IVPB  500 mg Intravenous Q6H PRN Therisa DoyneAnastassia Doutova, MD      . mometasone-formoterol (DULERA) 100-5 MCG/ACT inhaler 2 puff  2 puff Inhalation BID Therisa DoyneAnastassia Doutova, MD   2 puff at 08/29/16 0927  . ondansetron (ZOFRAN) injection 4 mg  4 mg Intravenous Q6H PRN Albertine GratesFang Lorris Carducci, MD   4 mg at 08/27/16 0859  . potassium chloride SA (K-DUR,KLOR-CON) CR tablet 40 mEq  40 mEq Oral Once Therisa DoyneAnastassia Doutova, MD   Stopped at 08/27/16 0031  . potassium chloride SA (K-DUR,KLOR-CON) CR tablet 40 mEq   40 mEq Oral Once Albertine GratesFang Zaire Vanbuskirk, MD         Discharge Medications: Please see discharge summary for a list of discharge medications.  Relevant Imaging Results:  Relevant Lab Results:   Additional Information SSN [id number]  Antionette PolesKimberly L Long, LCSW

## 2016-08-29 NOTE — Progress Notes (Signed)
qPhysical Therapy Treatment Patient Details Name: Monica Caldwell MRN: [id number] DOB: 09/24/1928 Today's Date: 08/29/2016    History of Present Illness 83 yo female s/p IM nail R femoral shaft 08/27/16. Hx of osteoporosis.    PT Comments    Pt assisted with sitting EOB.  Pt attempted to stand however unable due to R LE pain.  Pt likely to d/c to SNF today.  Follow Up Recommendations  SNF     Equipment Recommendations  Other (comment) (TBD next venue)    Recommendations for Other Services       Precautions / Restrictions Precautions Precautions: Fall Restrictions Weight Bearing Restrictions: Yes RLE Weight Bearing: Partial weight bearing RLE Partial Weight Bearing Percentage or Pounds: 30    Mobility  Bed Mobility Overal bed mobility: Needs Assistance Bed Mobility: Supine to Sit;Sit to Supine     Supine to sit: Max assist;+2 for physical assistance;+2 for safety/equipment;HOB elevated Sit to supine: +2 for physical assistance;+2 for safety/equipment;HOB elevated;Max assist   General bed mobility comments: pt able to assist a little with upper body however required assist for lower body, utilized bed pad for scooting and positioning, increased time required  Transfers Overall transfer level: Needs assistance Equipment used: Rolling walker (2 wheeled) Transfers: Sit to/from Stand Sit to Stand: +2 physical assistance         General transfer comment: educated pt on PWB 30% R LE, pt attempted sit to stand x2 however unable to rise from bed due to increased pain  Ambulation/Gait                 Stairs            Wheelchair Mobility    Modified Rankin (Stroke Patients Only)       Balance                                            Cognition Arousal/Alertness: Awake/alert Behavior During Therapy: WFL for tasks assessed/performed Overall Cognitive Status: Within Functional Limits for tasks assessed                                         Exercises Total Joint Exercises Short Arc Quad: AROM;Right;10 reps;Seated    General Comments        Pertinent Vitals/Pain Pain Assessment: 0-10 Pain Score: 6  Pain Location: R LE with transfers Pain Descriptors / Indicators: Aching;Sore Pain Intervention(s): Limited activity within patient's tolerance;Repositioned;Premedicated before session    Home Living                      Prior Function            PT Goals (current goals can now be found in the care plan section) Progress towards PT goals: Progressing toward goals    Frequency    Min 3X/week      PT Plan Current plan remains appropriate    Co-evaluation             End of Session   Activity Tolerance: Patient limited by fatigue;Patient limited by pain Patient left: in bed;with call bell/phone within reach;with bed alarm set   PT Visit Diagnosis: Muscle weakness (generalized) (M62.81);Difficulty in walking, not elsewhere classified (R26.2)     Time: [id number] PT Time  Calculation (min) (ACUTE ONLY): 25 min  Charges:  $Therapeutic Activity: 23-37 mins                    G Codes:       Monica Caldwell, PT, DPT 08/29/2016 Pager: [phone]  Monica Caldwell 08/29/2016, 12:37 PM

## 2016-08-29 NOTE — Discharge Summary (Signed)
Discharge Summary  Monica AreolaBarbara J Hailes WJX:[id number] DOB: 06/15/1928  PCP: Pcp Not In System  Admit date: 08/26/2016 Discharge date: 08/30/2016  Time spent: >6530mins  Recommendations for Outpatient Follow-up:  1. F/u with SNF MD for hospital discharge follow up, repeat cbc/bmp at follow up 2. f/u with orthopedics in two weeks 3. f/u with cardiology for grade I diastolic dysfunction, dynamic obstruction at rest   Discharge Diagnoses:  Active Hospital Problems   Diagnosis Date Noted  . Closed fracture of right femur, unspecified fracture morphology, initial encounter (HCC) 08/26/2016  . Closed right hip fracture (HCC) 08/27/2016  . Atypical fracture of femur 08/27/2016  . Hypokalemia 08/26/2016  . Hypertension 08/26/2016  . Asthma 08/26/2016  . Femur fracture, right (HCC) 08/26/2016  . Cardiomegaly 08/26/2016  . Heart murmur 08/26/2016    Resolved Hospital Problems   Diagnosis Date Noted Date Resolved  No resolved problems to display.    Discharge Condition: stable  Diet recommendation: heart healthy  Filed Weights   08/26/16 1824  Weight: 74.8 kg (165 lb)    History of present illness:  PCP: [facility] Outpatient Specialists:[facility]   Patient coming from:    home Lives   With family    Chief Complaint: Right femur pain  HPI: Monica Caldwell is a 83 y.o. female with medical history significant of osteoporosis, asthma-COPD, hypertension    Presented with a fall. Patient stood up from sitting position try to turn may have lost balance and fell down. Very soon thereafter she was found by her daughter patient had severe pain in her right leg with deformity and was unable to bear weight and EMS was called. She denies loss of consciousness or head trauma patient was given fentanyl and 200 g with some improvement in pain. She gets occasional Short of breath due to asthma, not on oxygen, never smoker no hx of CAD or CHF but never had a stress test  or echo. She is able to walk up the stairs and to the mail box without SOB or chest pain   Regarding pertinent Chronic problems: Patient has history of posterior paralysis and have had closed fracture of the right distal radius in 2014 Regarding patient history of COPD spirometry from 2017 FEV1 72% liters   FEV1/FVC 105% %   FVC 70% liters   PEAK FLOW 88% 20 - 800 L/MIN      IN ER:  Temp (24hrs), Avg:98.4 F (36.9 C), Min:98.4 F (36.9 C), Max:98.4 F (36.9 C)      RR 17, 93% HR  77  BP  163/63 K 3.1 Cr 1.02 WBC 9.1 Hg 12.2  Pelvic films no evidence of fracture dislocation  chest x-ray mild cardiomegaly Right femur as significantly angulated mildly displaced fracture of the proximal third of the right femoral diaphysis Following Medications were ordered in ER: Medications  fentaNYL (SUBLIMAZE) injection 50 mcg (50 mcg Intravenous Given 08/26/16 1910)  fentaNYL (SUBLIMAZE) injection 50 mcg (50 mcg Intravenous Given 08/26/16 2029)     ER provider discussed case with:  Dr. Shon BatonBrooks with orthopedics [facility] plan to have operative repair in a.m. by Dr. Veda CanningSwintek  nothing by mouth post midnight  Hospitalist was called for admission for femoral fracture    Hospital Course:  Principal Problem:   Closed fracture of right femur, unspecified fracture morphology, initial encounter [facility](HCC) Active Problems:   Hypokalemia   Hypertension   Asthma   Femur fracture, right (HCC)  Cardiomegaly   Heart murmur   Closed right hip fracture (HCC)   Atypical fracture of femur   Closed fracture of right femur, unspecified fracture morphology, initial encounter (HCC) -  Rightatypical femur fracture, s/p Intramedullary fixation, Rightfemur. On 3/24 D/c bisphosphonate due to right hip atypical fracture per ortho  On lovenox for DVT prophylaxis per ortho Prn pain meds per ortho To SNF and close follow up with ortho Dr Linna Caprice, Cloyde Reams in two  weeks  h/o osteoporosis: on vitamin /calcium , d/c fosamax per ortho  Hypertension :  home med norvasc discontinued, she is started on lopressor (grade I diastolic dysfunction, dynamic obstruction at rest on echocardiogram) She is on prn  lasix and daily maxzide at home.  Diuretic held since admission, she received iv lasix 40mg   And discharged on home meds.  Cardiomegaly and heart murmur,echo with adequate LVEF , dynamic obstruction at rest, grade I diastolic dysfunction,  euvolemic on exam She is started on lopressor, continued on home diuretics She is to follow up with cardiology  Hypokalemia -  Potassium 3.1 on admission   replaced, k 3.6 and magnesium 2 at discharge SNF MD to monitor potassium level, she is discharged on potassium supplement mwf for total of 10 tabs, she is resumed on daily Maxide.  Asthma -stable, no wheezing,   continue home medication advair    DVT prophylaxis:per ortho  Code Status:DNR/DNI as per patient   Family Communication:patient  Disposition Plan: SNF  on 3/27    Consultants:  Ortho, Dr Linna Caprice, Cloyde Reams  Procedures: Intramedullary fixation, Rightfemur. For Rightatypical femur fracture by Dr Linna Caprice on 3/24  IMPLANTS: Biomet Affixus hip fracture nail 11 x 380 mm, 130. 10.5 x 95 mm lag screw. 5 x 42 mm distal interlocking screw 1.  Antibiotics:  Perioperative cefazolin   Discharge Exam: BP 113/65   Pulse 88   Temp 97.6 F (36.4 C) (Oral)   Resp 16   Ht 5\' 4"  (1.626 m)   Wt 74.8 kg (165 lb)   SpO2 93%   BMI 28.32 kg/m   General: NAD Cardiovascular: RRR ,+systolic murmur Respiratory: CTABL Extremity: post op changes right lower extremity  Discharge Instructions You were cared for by a hospitalist during your hospital stay. If you have any questions about your discharge medications or the care you received while you were in the hospital after you are discharged, you can call the  unit and asked to speak with the hospitalist on call if the hospitalist that took care of you is not available. Once you are discharged, your primary care physician will handle any further medical issues. Please note that NO REFILLS for any discharge medications will be authorized once you are discharged, as it is imperative that you return to your primary care physician (or establish a relationship with a primary care physician if you do not have one) for your aftercare needs so that they can reassess your need for medications and monitor your lab values.  Discharge Instructions    Diet - low sodium heart healthy    Complete by:  As directed    Increase activity slowly    Complete by:  As directed      Allergies as of 08/30/2016   No Known Allergies     Medication List    STOP taking these medications   alendronate 70 MG tablet Commonly known as:  FOSAMAX   amLODipine 10 MG tablet Commonly known as:  NORVASC   HYDROcodone-acetaminophen 10-325 MG  tablet Commonly known as:  NORCO Replaced by:  HYDROcodone-acetaminophen 5-325 MG tablet     TAKE these medications   bisacodyl 10 MG suppository Commonly known as:  DULCOLAX Place 1 suppository (10 mg total) rectally daily.   calcium carbonate 1250 (500 Ca) MG chewable tablet Commonly known as:  OS-CAL Chew 1 tablet by mouth daily.   cholecalciferol 1000 units tablet Commonly known as:  VITAMIN D Take 1,000 Units by mouth daily.   enoxaparin 40 MG/0.4ML injection Commonly known as:  LOVENOX Inject 0.4 mLs (40 mg total) into the skin daily.   fluticasone-salmeterol 115-21 MCG/ACT inhaler Commonly known as:  ADVAIR HFA Inhale 2 puffs into the lungs 2 (two) times daily.   ADVAIR DISKUS 100-50 MCG/DOSE Aepb Generic drug:  Fluticasone-Salmeterol Inhale 1 puff into the lungs daily.   furosemide 20 MG tablet Commonly known as:  LASIX Take 20 mg by mouth daily. As needed   Ginseng 100 MG Caps Take 1 capsule by mouth daily.    HYDROcodone-acetaminophen 5-325 MG tablet Commonly known as:  NORCO/VICODIN Take 1-2 tablets by mouth every 6 (six) hours as needed for moderate pain. Replaces:  HYDROcodone-acetaminophen 10-325 MG tablet   metoprolol tartrate 25 MG tablet Commonly known as:  LOPRESSOR Take 0.5 tablets (12.5 mg total) by mouth 2 (two) times daily.   potassium chloride SA 20 MEQ tablet Commonly known as:  K-DUR,KLOR-CON Take 1 tablet (20 mEq total) by mouth every Monday, Wednesday, and Friday.   pregabalin 25 MG capsule Commonly known as:  LYRICA TAKE 1-2 CAPSULES BY MOUTH EVERY 8 HOURS AS NEEDED FOR NERVE PANI   PROAIR HFA 108 (90 Base) MCG/ACT inhaler Generic drug:  albuterol INHALE 2 PUFFS BY MOUTH EVERY 4 HOURS AS NEEDED FOR RESCUE FROM WHEEZING OR SHORTNESS OF BREATH   sennosides-docusate sodium 8.6-50 MG tablet Commonly known as:  SENOKOT-S Take 1 tablet by mouth at bedtime. What changed:  how much to take  when to take this   triamterene-hydrochlorothiazide 37.5-25 MG tablet Commonly known as:  MAXZIDE-25 Take 1 tablet by mouth daily.      No Known Allergies Follow-up Information    Swinteck, Cloyde Reams, MD. Schedule an appointment as soon as possible for a visit in 2 week(s).   Specialty:  Orthopedic Surgery Why:  For wound re-check Contact information: [address] [place] [address] [phone]        [facility] [address] [facility] Follow up in 3 week(s).   Specialty:  Cardiology Why:  grade I diastolic dysfunction, dynamic obstruction at rest  Contact information: [address] [place] [address] [phone]           The results of significant diagnostics from this hospitalization (including imaging, microbiology, ancillary and laboratory) are listed below for reference.    Significant Diagnostic Studies: Dg Chest 1 View  Result Date: 08/26/2016 CLINICAL DATA:  Status post fall, with concern for  chest injury. Initial encounter. EXAM: CHEST 1 VIEW COMPARISON:  None. FINDINGS: The lungs are well-aerated. Mild vascular congestion is noted. There is no evidence of focal opacification, pleural effusion or pneumothorax. The cardiomediastinal silhouette is mildly enlarged. No acute osseous abnormalities are seen. IMPRESSION: Mild vascular congestion and mild cardiomegaly. Lungs remain grossly clear. Electronically Signed   By: Roanna Raider M.D.   On: 08/26/2016 20:20   Dg Pelvis 1-2 Views  Result Date: 08/26/2016 CLINICAL DATA:  Status post fall, with right hip pain. Initial encounter. EXAM: PELVIS -  1-2 VIEW COMPARISON:  None. FINDINGS: There is no evidence of fracture or dislocation. Both femoral heads are seated normally within their respective acetabula. No significant degenerative change is appreciated. The sacroiliac joints are unremarkable in appearance. The visualized bowel gas pattern is grossly unremarkable in appearance. IMPRESSION: No evidence of fracture or dislocation. Electronically Signed   By: Roanna Raider M.D.   On: 08/26/2016 19:56   Dg Pelvis Portable  Result Date: 08/27/2016 CLINICAL DATA:  Post intraoperative right femur fracture repair EXAM: RIGHT FEMUR PORTABLE 2 VIEW; PORTABLE PELVIS 1-2 VIEWS COMPARISON:  08/26/2016 FINDINGS: Single frontal view of the pelvis submitted. The patient is status post intraoperative repair of proximal shaft right femoral fracture. There is intramedullary long nail partially visualized in proximal femur. The fracture site is not included on the film. A locking metallic screws is noted along the right femoral neck. There is anatomic alignment. IMPRESSION: The patient is status post intraoperative repair of proximal shaft right femoral fracture. There is intramedullary long nail partially visualized in proximal femur. The fracture site is not included on the film. A locking metallic screws is noted along the right femoral neck. There is anatomic  alignment. Please see the operative report. Electronically Signed   By: Natasha Mead M.D.   On: 08/27/2016 13:17   Dg C-arm 1-60 Min-no Report  Result Date: 08/27/2016 Fluoroscopy was utilized by the requesting physician.  No radiographic interpretation.   Dg Femur, Min 2 Views Right  Result Date: 08/27/2016 CLINICAL DATA:  83 year old female with a history of intramedullary nail placement for right femur fracture EXAM: RIGHT FEMUR 2 VIEWS COMPARISON:  08/26/2016 FINDINGS: Limited intraoperative fluoroscopic spot images during open reduction internal fixation of right femur. Interval placement of antegrade intramedullary nail across proximal femur fracture with improved alignment. Single distal interlocking screw. Proximal interlocking femoral neck screw. No immediate complicating features. IMPRESSION: Intraoperative fluoroscopic spot images during open reduction internal fixation of right femur fracture, as above. Please refer to the dictated operative report for full details of intraoperative findings and procedure. Electronically Signed   By: Gilmer Mor D.O.   On: 08/27/2016 11:34   Dg Femur, Min 2 Views Right  Result Date: 08/26/2016 CLINICAL DATA:  Thigh pain and deformity after falling. Initial encounter. EXAM: RIGHT FEMUR 2 VIEWS COMPARISON:  None. FINDINGS: There is a complete transverse fracture involving the diaphysis of the right femur at the junction of its proximal and middle thirds. This fracture demonstrates significant apex anterior angulation and mild displacement. The femoral head and neck appear intact without dislocation. No injury identified at the knee. IMPRESSION: Significantly angulated and mildly displaced fracture of the proximal third of the right femoral diaphysis. Electronically Signed   By: Carey Bullocks M.D.   On: 08/26/2016 19:56   Dg Femur Port Min 2 Views Left  Result Date: 08/27/2016 CLINICAL DATA:  Pain following fall EXAM: LEFT FEMUR PORTABLE 2 VIEWS  COMPARISON:  None. FINDINGS: Frontal and lateral views were obtained. There is no appreciable fracture or dislocation. There is moderate narrowing of the left hip joint. No erosive change. No knee joint effusion. No abnormal periosteal reaction. IMPRESSION: Moderate narrowing left hip joint. No fracture or dislocation evident. Electronically Signed   By: Bretta Bang III M.D.   On: 08/27/2016 10:02   Dg Femur Omega, [place] 2 Views Right  Result Date: 08/27/2016 CLINICAL DATA:  Post intraoperative right femur fracture repair EXAM: RIGHT FEMUR PORTABLE 2 VIEW; PORTABLE PELVIS 1-2 VIEWS COMPARISON:  08/26/2016 FINDINGS: Single  frontal view of the pelvis submitted. The patient is status post intraoperative repair of proximal shaft right femoral fracture. There is intramedullary long nail partially visualized in proximal femur. The fracture site is not included on the film. A locking metallic screws is noted along the right femoral neck. There is anatomic alignment. IMPRESSION: The patient is status post intraoperative repair of proximal shaft right femoral fracture. There is intramedullary long nail partially visualized in proximal femur. The fracture site is not included on the film. A locking metallic screws is noted along the right femoral neck. There is anatomic alignment. Please see the operative report. Electronically Signed   By: Natasha Mead M.D.   On: 08/27/2016 13:17    Microbiology: No results found for this or any previous visit (from the past 240 hour(s)).   Labs: Basic Metabolic Panel:  Recent Labs Lab 08/26/16 1843 08/28/16 0457 08/29/16 0535 08/30/16 0642  NA 142 141 141 141  K 3.1* 3.7 3.6 3.7  CL 107 109 107 104  CO2 28 28 30  32  GLUCOSE 143* 113* 101* 111*  BUN 22* 19 17 18   CREATININE 1.02* 0.98 0.75 0.75  CALCIUM 8.9 7.9* 8.1* 8.4*  MG 2.0 1.9  --  2.0   Liver Function Tests:  Recent Labs Lab 08/27/16 1421 08/28/16 0457  AST  --  36  ALT  --  22  ALKPHOS  --  36*   BILITOT  --  0.6  PROT  --  5.2*  ALBUMIN 3.3* 2.8*   No results for input(s): LIPASE, AMYLASE in the last 168 hours. No results for input(s): AMMONIA in the last 168 hours. CBC:  Recent Labs Lab 08/26/16 1843 08/27/16 1421 08/28/16 0457 08/29/16 0535 08/30/16 0642  WBC 9.1 13.6* 10.7* 9.2 10.2  NEUTROABS 5.8  --   --   --   --   HGB 12.2 11.4* 10.0* 10.7* 10.8*  HCT 37.0 34.7* 30.6* 32.8* 33.5*  MCV 88.5 89.2 89.7 90.9 87.7  PLT 258 220 216 204 225   Cardiac Enzymes: No results for input(s): CKTOTAL, CKMB, CKMBINDEX, TROPONINI in the last 168 hours. BNP: BNP (last 3 results) No results for input(s): BNP in the last 8760 hours.  ProBNP (last 3 results) No results for input(s): PROBNP in the last 8760 hours.  CBG: No results for input(s): GLUCAP in the last 168 hours.     SignedAlbertine Grates MD, PhD  [facility] 08/30/2016, 11:28 AM

## 2016-08-29 NOTE — Progress Notes (Signed)
Chaplain responding to consult that patient would like information on Advanced Directive.  Patient states that her daughter will be here soon and she is the one that she would like to have Medical POA for her.  I asked the patient to please have the chaplain paged when her daughter arrives.      08/29/16 0955  Clinical Encounter Type  Visited With Patient  Visit Type Initial   Monica MeagerMartin, Monica Caldwell

## 2016-08-29 NOTE — Progress Notes (Signed)
  Echocardiogram 2D Echocardiogram with Definity has been performed.  Nolon RodBrown, Tony 08/29/2016, 3:10 PM

## 2016-08-30 LAB — CBC
HCT: 33.5 % — ABNORMAL LOW (ref 36.0–46.0)
Hemoglobin: 10.8 g/dL — ABNORMAL LOW (ref 12.0–15.0)
MCH: 28.3 pg (ref 26.0–34.0)
MCHC: 32.2 g/dL (ref 30.0–36.0)
MCV: 87.7 fL (ref 78.0–100.0)
PLATELETS: 225 10*3/uL (ref 150–400)
RBC: 3.82 MIL/uL — ABNORMAL LOW (ref 3.87–5.11)
RDW: 14.2 % (ref 11.5–15.5)
WBC: 10.2 10*3/uL (ref 4.0–10.5)

## 2016-08-30 LAB — BASIC METABOLIC PANEL
Anion gap: 5 (ref 5–15)
BUN: 18 mg/dL (ref 6–20)
CO2: 32 mmol/L (ref 22–32)
CREATININE: 0.75 mg/dL (ref 0.44–1.00)
Calcium: 8.4 mg/dL — ABNORMAL LOW (ref 8.9–10.3)
Chloride: 104 mmol/L (ref 101–111)
GFR calc Af Amer: >60 mL/min (ref >60)
GFR calc non Af Amer: >60 mL/min (ref >60)
Glucose, Bld: 111 mg/dL — ABNORMAL HIGH (ref 65–99)
Potassium: 3.7 mmol/L (ref 3.5–5.1)
SODIUM: 141 mmol/L (ref 135–145)

## 2016-08-30 LAB — MAGNESIUM: MAGNESIUM: 2 mg/dL (ref 1.7–2.4)

## 2016-08-30 MED ORDER — ENOXAPARIN SODIUM 40 MG/0.4ML ~~LOC~~ SOLN: 40.0000 mg | SUBCUTANEOUS | 0 refills | Status: DC

## 2016-08-30 MED ORDER — METOPROLOL TARTRATE 25 MG PO TABS: 12.5000 mg | ORAL_TABLET | Freq: Two times a day (BID) | ORAL | 0 refills | Status: DC

## 2016-08-30 NOTE — Clinical Social Work Note (Signed)
Clinical Social Work Assessment  Patient Details  Name: Monica Caldwell MRN: [id number] Date of Birth: 12/31/1928  Date of referral:  08/29/16               Reason for consult:  Facility Placement                Permission sought to share information with:  Facility Medical sales representativeContact Representative, Family Supports Permission granted to share information::  Yes, Verbal Permission Granted  Name::     [name]   Agency::     Relationship::  Daughter  Contact Information:  [phone]  Housing/Transportation Living arrangements for the past 2 months:  Single Family Home Source of Information:  Adult Children Patient Interpreter Needed:  None Criminal Activity/Legal Involvement Pertinent to Current Situation/Hospitalization:  No - Comment as needed Significant Relationships:  Adult Children Lives with:    Do you feel safe going back to the place where you live?  No Need for family participation in patient care:  Yes (Comment)  Care giving concerns:  Patient needs SNF for ST rehab.   Social Worker assessment / plan:  Patient requested that CSW speak with daughter regarding placement. CSW contacted patient's daughter, patient's daughter reported that she is agreeable to SNF. Patient's daughter reported that she was dealing with another family member's medical situation and unable to speak with CSW. Patient's daughter requested to be contacted with bed offers for patient. CSW will continue to follow patient for discharge planning.   Employment status:  Retired Database administratornsurance information:  Managed Medicare PT Recommendations:  Skilled Nursing Facility Information / Referral to community resources:  Skilled Nursing Facility  Patient/Family's Response to care:  Agreeable to SNF.  Patient/Family's Understanding of and Emotional Response to Diagnosis, Current Treatment, and Prognosis:  Unable to assess.   Emotional Assessment Appearance:  Appears stated age Attitude/Demeanor/Rapport:  Unable to  Assess Affect (typically observed):  Unable to Assess Orientation:    Alcohol / Substance use:  Not Applicable Psych involvement (Current and /or in the community):  No (Comment)  Discharge Needs  Concerns to be addressed:  No discharge needs identified Readmission within the last 30 days:  No Current discharge risk:  None Barriers to Discharge:  No Barriers Identified   Antionette PolesKimberly L Folashade Gamboa, LCSW 08/30/2016, 12:35 PM

## 2016-08-30 NOTE — Clinical Social Work Placement (Signed)
Patient received and accepted bed offer at [facility]. [facility] contacted for transportation at 2pm, family aware. Patient's RN can call report to [phone], patient is going to room 3235.  CLINICAL SOCIAL WORK PLACEMENT  NOTE  Date:  08/30/2016  Patient Details  Name: Monica Caldwell MRN: [id number] Date of Birth: 05/25/1929  Clinical Social Work is seeking post-discharge placement for this patient at the Skilled  Nursing Facility level of care (*CSW will initial, date and re-position this form in  chart as items are completed):  Yes   Patient/family provided with [facility] Clinical Social Work Department's list of facilities offering this level of care within the geographic area requested by the patient (or if unable, by the patient's family).  Yes   Patient/family informed of their freedom to choose among providers that offer the needed level of care, that participate in Medicare, Medicaid or managed care program needed by the patient, have an available bed and are willing to accept the patient.  Yes   Patient/family informed of [facility]'s ownership interest in [facility] and [facility], as well as of the fact that they are under no obligation to receive care at these facilities.  PASRR submitted to EDS on       PASRR number received on       Existing PASRR number confirmed on 08/29/16     FL2 transmitted to all facilities in geographic area requested by pt/family on 08/29/16     FL2 transmitted to all facilities within larger geographic area on 08/29/16     Patient informed that his/her managed care company has contracts with or will negotiate with certain facilities, including the following:        Yes   Patient/family informed of bed offers received.  [facility]     Physician recommends and patient chooses bed at      [facility].  Patient to be transferred to  facility by [facility]     Patient family notified on 08/30/16 of transfer.  Name of family member notified:  Ardelle Antononna Bowers     PHYSICIAN       Additional Comment:    _______________________________________________ Antionette PolesKimberly L Kyrin Gratz, LCSW 08/30/2016, 12:45 PM

## 2016-08-30 NOTE — Care Management Important Message (Signed)
Important Message  Patient Details  Name: Minerva AreolaBarbara J Redwine MRN: [id number] Date of Birth: 12/23/1928   Medicare Important Message Given:  Yes    Caren MacadamFuller, Tashi Andujo 08/30/2016, 10:40 AMImportant Message  Patient Details  Name: Minerva AreolaBarbara J Taitano MRN: [id number] Date of Birth: 05/25/1929   Medicare Important Message Given:  Yes    Caren MacadamFuller, Leopold Smyers 08/30/2016, 10:40 AM

## 2016-08-31 ENCOUNTER — Encounter (HOSPITAL_COMMUNITY): Payer: Self-pay | Admitting: Orthopedic Surgery

## 2016-09-22 ENCOUNTER — Ambulatory Visit (INDEPENDENT_AMBULATORY_CARE_PROVIDER_SITE_OTHER): Payer: Medicare Other | Admitting: Interventional Cardiology

## 2016-09-22 ENCOUNTER — Encounter: Payer: Self-pay | Admitting: Interventional Cardiology

## 2016-09-22 VITALS — BP 122/68 | HR 82 | Ht 64.0 in | Wt 163.0 lb

## 2016-09-22 DIAGNOSIS — R600 Localized edema: Secondary | ICD-10-CM | POA: Diagnosis not present

## 2016-09-22 DIAGNOSIS — I5189 Other ill-defined heart diseases: Secondary | ICD-10-CM

## 2016-09-22 DIAGNOSIS — I5032 Chronic diastolic (congestive) heart failure: Secondary | ICD-10-CM | POA: Insufficient documentation

## 2016-09-22 DIAGNOSIS — I10 Essential (primary) hypertension: Secondary | ICD-10-CM

## 2016-09-22 DIAGNOSIS — I519 Heart disease, unspecified: Secondary | ICD-10-CM

## 2016-09-22 DIAGNOSIS — I4510 Unspecified right bundle-branch block: Secondary | ICD-10-CM | POA: Diagnosis not present

## 2016-09-22 NOTE — Patient Instructions (Signed)
Medication Instructions:  Your physician recommends that you continue on your current medications as directed. Please refer to the Current Medication list given to you today.   Labwork: None ordered  Testing/Procedures: None ordered  Follow-Up: As needed  Any Other Special Instructions Will Be Listed Below (If Applicable).     If you need a refill on your cardiac medications before your next appointment, please call your pharmacy.  

## 2016-09-22 NOTE — Progress Notes (Signed)
Cardiology Office Note   Date:  09/22/2016   ID:  Monica Caldwell, DOB 01-07-1929, MRN [id number]  PCP:  Vivien Presto, MD    No chief complaint on file. abnormal echo   Wt Readings from Last 3 Encounters:  09/22/16 163 lb (73.9 kg)  08/26/16 165 lb (74.8 kg)  01/11/13 176 lb (79.8 kg)       History of Present Illness: Monica Caldwell is a 83 y.o. female who is being seen today for the evaluation of diastolic dysfunction at the request of Albertine Grates, MD.  She sees Dr. Salvadore Farber in [facility]  She was hospitalized for femur fracture and had surgery.  She had an echo showing: Left ventricle: The cavity size was normal. There was mild   concentric hypertrophy. Systolic function was vigorous. The   estimated ejection fraction was in the range of 65% to 70%. There   was dynamic obstruction at rest, with a peak velocity of 171   cm/sec and a peak gradient of 12 mm Hg. Wall motion was normal;   there were no regional wall motion abnormalities. Doppler   parameters are consistent with abnormal left ventricular   relaxation (grade 1 diastolic dysfunction). Doppler parameters   are consistent with indeterminate ventricular filling pressure. - Aortic valve: Transvalvular velocity was within the normal range.   There was no stenosis. There was no regurgitation. - Mitral valve: Transvalvular velocity was within the normal range.   There was no evidence for stenosis. There was no regurgitation. - Right ventricle: The cavity size was normal. Wall thickness was   normal. Systolic function was normal. - Atrial septum: No defect or patent foramen ovale was identified   by color flow Doppler. - Tricuspid valve: There was no regurgitation. - Pulmonary arteries: Systolic pressure was within the normal   range. PA peak pressure: 18 mm Hg (S).   She denies any chest discomfort or shortness of breath. She does have intermittent lower extremity swelling at times. She does try to watch  her salt intake. Overall, she feels well from a cardiac standpoint. No palpitations, lightheadedness. She has been treated for hypertension for several years.   Past Medical History:  Diagnosis Date  . Arthritis   . Asthma   . Closed fracture of right distal radius and ulna 01/11/2013  . Hypertension   . Osteoporosis   . Wears dentures    top    Past Surgical History:  Procedure Laterality Date  . CHOLECYSTECTOMY    . COLONOSCOPY    . FEMUR IM NAIL Right 08/27/2016   Procedure: INTRAMEDULLARY (IM) NAIL FEMORAL;  Surgeon: Samson Frederic, MD;  Location: [facility];  Service: Orthopedics;  Laterality: Right;  . KIDNEY STONE SURGERY     removal lt stone  . OPEN REDUCTION INTERNAL FIXATION (ORIF) DISTAL RADIAL FRACTURE Right 01/11/2013   Procedure: OPEN REDUCTION INTERNAL FIXATION (ORIF) DISTAL AND ULNA RADIAL FRACTURE;  Surgeon: Eulas Post, MD;  Location: [facility];  Service: Orthopedics;  Laterality: Right;  . TUBAL LIGATION       Current Outpatient Prescriptions  Medication Sig Dispense Refill  . ADVAIR DISKUS 100-50 MCG/DOSE AEPB Inhale 1 puff into the lungs daily.     Marland Kitchen albuterol (PROAIR HFA) 108 (90 Base) MCG/ACT inhaler INHALE 2 PUFFS BY MOUTH EVERY 4 HOURS AS NEEDED FOR RESCUE FROM WHEEZING OR SHORTNESS OF BREATH    . bisacodyl (DULCOLAX) 10 MG suppository Place 10 mg rectally as needed for moderate  constipation.    . calcium carbonate (OS-CAL) 1250 MG chewable tablet Chew 1 tablet by mouth daily.    . cholecalciferol (VITAMIN D) 1000 UNITS tablet Take 1,000 Units by mouth daily.    Marland Kitchen enoxaparin (LOVENOX) 40 MG/0.4ML injection Inject 0.4 mLs (40 mg total) into the skin daily. 30 Syringe 0  . fluticasone-salmeterol (ADVAIR HFA) 115-21 MCG/ACT inhaler Inhale 2 puffs into the lungs 2 (two) times daily.    . furosemide (LASIX) 20 MG tablet Take 20 mg by mouth daily. As needed    . Ginseng 100 MG CAPS Take 1 capsule by mouth daily.     Marland Kitchen HYDROcodone-acetaminophen  (NORCO/VICODIN) 5-325 MG tablet Take 1-2 tablets by mouth every 6 (six) hours as needed for moderate pain. 10 tablet 0  . metoprolol tartrate (LOPRESSOR) 25 MG tablet Take 0.5 tablets (12.5 mg total) by mouth 2 (two) times daily. 60 tablet 0  . potassium chloride SA (K-DUR,KLOR-CON) 20 MEQ tablet Take 1 tablet (20 mEq total) by mouth every Monday, Wednesday, and Friday. 10 tablet 0  . pregabalin (LYRICA) 25 MG capsule TAKE 1-2 CAPSULES BY MOUTH EVERY 8 HOURS AS NEEDED FOR NERVE PANI    . sennosides-docusate sodium (SENOKOT-S) 8.6-50 MG tablet Take 1 tablet by mouth at bedtime. 30 tablet 0  . triamterene-hydrochlorothiazide (MAXZIDE-25) 37.5-25 MG tablet Take 1 tablet by mouth daily.      No current facility-administered medications for this visit.     Allergies:   Patient has no known allergies.    Social History:  The patient  reports that she has never smoked. She has never used smokeless tobacco. She reports that she does not drink alcohol or use drugs.   Family History:  The patient's family history includes CAD in her mother; Hypertension in her brother and other; Lung cancer in her mother.    ROS:  Please see the history of present illness.   Otherwise, review of systems are positive for Right leg pain post fracture.   All other systems are reviewed and negative.    PHYSICAL EXAM: VS:  BP 122/68 (BP Location: Right Arm)   Pulse 82   Ht  (1.626 m)   Wt 163 lb (73.9 kg)   BMI 27.98 kg/m  , BMI Body mass index is 27.98 kg/m. GEN: Well nourished, well developed, in no acute distress  HEENT: normal  Neck: no JVD, carotid bruits, or masses Cardiac: RRR; soft systolic murmurs,no rubs, or gallops; mild pretibial edema bilaterally Respiratory:  clear to auscultation bilaterally, normal work of breathing GI: soft, nontender, nondistended, + BS MS: no deformity or atrophy  Skin: warm and dry, no rash; erythema without excessive warmth on both ankle areas Neuro:  Strength and  sensation are intact Psych: euthymic mood, full affect   EKG:   The ekg ordered today demonstrates NSR RBBB   Recent Labs: 08/28/2016: ALT 22 08/30/2016: BUN 18; Creatinine, Ser 0.75; Hemoglobin 10.8; Magnesium 2.0; Platelets 225; Potassium 3.7; Sodium 141   Lipid Panel No results found for: CHOL, TRIG, HDL, CHOLHDL, VLDL, LDLCALC, LDLDIRECT   Other studies Reviewed: Additional studies/ records that were reviewed today with results demonstrating: echo result as noted above.   ASSESSMENT AND PLAN:  1. Diastolic dysfunction: Age-appropriate. Minimal left ventricular outflow gradient noted. She is asymptomatic. No further workup needed. Continue diuretic for leg swelling.  No symptoms of ischemia at this time. 2. Okay to take additional Lasix for signs of fluid retention or additional leg swelling. 3. Continue aggressive  blood pressure control. 4. Right bundle branch block noted on ECG.   Current medicines are reviewed at length with the patient today.  The patient concerns regarding her medicines were addressed.  The following changes have been made:  No change  Labs/ tests ordered today include:  No orders of the defined types were placed in this encounter.   Recommend 150 minutes/week of aerobic exercise Low fat, low carb, high fiber diet recommended  Disposition:   FU as needed   Signed, Lance Muss, MD  09/22/2016 2:29 PM    [facility] [address] [place], [place], [place]  [address] Phone: [phone]; Fax: [phone]

## 2016-11-07 NOTE — Anesthesia Postprocedure Evaluation (Signed)
Anesthesia Post Note  Patient: Monica Caldwell  Procedure(Caldwell) Performed: Procedure(Caldwell) (LRB): INTRAMEDULLARY (IM) NAIL FEMORAL (Right)     Anesthesia Post Evaluation  Last Vitals:  Vitals:   08/29/16 2117 08/30/16 0934  BP: 118/74 113/65  Pulse:  88  Resp:    Temp:      Last Pain:  Vitals:   08/30/16 0729  TempSrc:   PainSc: Asleep                 Monica Caldwell

## 2016-11-07 NOTE — Addendum Note (Signed)
Addendum  created 11/07/16 1240 by Camilla Skeen, MD   Sign clinical note    

## 2016-11-10 ENCOUNTER — Encounter (HOSPITAL_COMMUNITY): Payer: Self-pay

## 2016-11-10 ENCOUNTER — Emergency Department (HOSPITAL_COMMUNITY)
Admission: EM | Admit: 2016-11-10 | Discharge: 2016-11-11 | Disposition: A | Discharge status: Home / Self Care | Payer: Medicare Other | Attending: Emergency Medicine | Admitting: Emergency Medicine

## 2016-11-10 DIAGNOSIS — X58XXXA Exposure to other specified factors, initial encounter: Secondary | ICD-10-CM | POA: Insufficient documentation

## 2016-11-10 DIAGNOSIS — Y999 Unspecified external cause status: Secondary | ICD-10-CM | POA: Diagnosis not present

## 2016-11-10 DIAGNOSIS — I10 Essential (primary) hypertension: Secondary | ICD-10-CM | POA: Diagnosis not present

## 2016-11-10 DIAGNOSIS — Z79899 Other long term (current) drug therapy: Secondary | ICD-10-CM | POA: Insufficient documentation

## 2016-11-10 DIAGNOSIS — J45909 Unspecified asthma, uncomplicated: Secondary | ICD-10-CM | POA: Insufficient documentation

## 2016-11-10 DIAGNOSIS — S76011A Strain of muscle, fascia and tendon of right hip, initial encounter: Secondary | ICD-10-CM | POA: Diagnosis not present

## 2016-11-10 DIAGNOSIS — Y929 Unspecified place or not applicable: Secondary | ICD-10-CM | POA: Insufficient documentation

## 2016-11-10 DIAGNOSIS — Y939 Activity, unspecified: Secondary | ICD-10-CM | POA: Diagnosis not present

## 2016-11-10 DIAGNOSIS — S79911A Unspecified injury of right hip, initial encounter: Secondary | ICD-10-CM | POA: Diagnosis present

## 2016-11-10 NOTE — ED Notes (Signed)
Bed: [id number] Expected date:  Expected time:  Means of arrival:  Comments: 88 yr leg injury/old fracture

## 2016-11-10 NOTE — ED Provider Notes (Signed)
[facility]-EMERGENCY DEPT Provider Note   CSN: [id number] Arrival date & time: 11/10/16  2123     History   Chief Complaint Chief Complaint  Patient presents with  . Hip Pain    HPI Monica Caldwell is a 83 y.o. female.  83 year old female with a history of asthma, arthritis, and hypertension status post ORIF of the right femur in March 2018 presents to the emergency department from her facility of her concerns for right hip pain. Patient had a hip x-ray today which showed an age indeterminate fracture 7 cm distal from the right lesser trochanter. Patient denies any recent falls. She states that she has been working hard in therapy and the providers have been pushing her farther than what she believes is appropriate. She has continued to weight-bear without difficulty. She denies any extremity numbness or weakness. No changes to the patient's surgical site. She has been taking pain medicine as needed for severe pain with relief. Daughter reports that facility was concerned about periprosthetic fracture given imaging findings today.   The history is provided by the patient. No language interpreter was used.  Hip Pain     Past Medical History:  Diagnosis Date  . Arthritis   . Asthma   . Closed fracture of right distal radius and ulna 01/11/2013  . Hypertension   . Osteoporosis   . Wears dentures    top    Patient Active Problem List   Diagnosis Date Noted  . Diastolic dysfunction 09/22/2016  . Bilateral lower extremity edema 09/22/2016  . RBBB 09/22/2016  . Closed right hip fracture (HCC) 08/27/2016  . Atypical fracture of femur 08/27/2016  . Hypokalemia 08/26/2016  . Closed fracture of right femur, unspecified fracture morphology, initial encounter (HCC) 08/26/2016  . Hypertension 08/26/2016  . Asthma 08/26/2016  . Femur fracture, right (HCC) 08/26/2016  . Cardiomegaly 08/26/2016  . Heart murmur 08/26/2016  . Closed fracture of right distal radius and ulna 01/11/2013     Past Surgical History:  Procedure Laterality Date  . CHOLECYSTECTOMY    . COLONOSCOPY    . FEMUR IM NAIL Right 08/27/2016   Procedure: INTRAMEDULLARY (IM) NAIL FEMORAL;  Surgeon: Samson Frederic, MD;  Location: [facility];  Service: Orthopedics;  Laterality: Right;  . KIDNEY STONE SURGERY     removal lt stone  . OPEN REDUCTION INTERNAL FIXATION (ORIF) DISTAL RADIAL FRACTURE Right 01/11/2013   Procedure: OPEN REDUCTION INTERNAL FIXATION (ORIF) DISTAL AND ULNA RADIAL FRACTURE;  Surgeon: Eulas Post, MD;  [facility];  Service: Orthopedics;  Laterality: Right;  . TUBAL LIGATION      OB History    No data available       Home Medications    Prior to Admission medications   Medication Sig Start Date End Date Taking? Authorizing Provider  bisacodyl (DULCOLAX) 5 MG EC tablet Take 5 mg by mouth daily.   Yes [provider]  Calcium 500 MG CHEW Chew 1 tablet by mouth daily.   Yes [provider]  cholecalciferol (VITAMIN D) 1000 UNITS tablet Take 1,000 Units by mouth daily.   Yes [provider]  fluticasone-salmeterol (ADVAIR HFA) 115-21 MCG/ACT inhaler Inhale 2 puffs into the lungs 2 (two) times daily.   Yes [provider]  fluticasone-salmeterol (ADVAIR HFA) 115-21 MCG/ACT inhaler Inhale 2 puffs into the lungs 2 (two) times daily.   Yes [provider]  furosemide (LASIX) 20 MG tablet Take 20 mg by mouth daily.  Yes [provider]  metoprolol tartrate (LOPRESSOR) 25 MG tablet Take 0.5 tablets (12.5 mg total) by mouth 2 (two) times daily. 08/30/16  Yes Albertine GratesXu, Fang, MD  potassium chloride SA (K-DUR,KLOR-CON) 20 MEQ tablet Take 1 tablet (20 mEq total) by mouth every Monday, Wednesday, and Friday. 08/29/16  Yes Albertine GratesXu, Fang, MD  sennosides-docusate sodium (SENOKOT-S) 8.6-50 MG tablet Take 1 tablet by mouth at bedtime. 08/29/16  Yes Albertine GratesXu, Fang, MD  triamterene-hydrochlorothiazide (MAXZIDE-25) 37.5-25 MG tablet Take 1 tablet  by mouth daily.  08/22/16  Yes [provider]  albuterol (PROAIR HFA) 108 (90 Base) MCG/ACT inhaler INHALE 2 PUFFS BY MOUTH EVERY 4 HOURS AS NEEDED FOR RESCUE FROM WHEEZING OR SHORTNESS OF BREATH 07/10/16   [provider]  enoxaparin (LOVENOX) 40 MG/0.4ML injection Inject 0.4 mLs (40 mg total) into the skin daily. 08/30/16 10/04/16  Albertine GratesXu, Fang, MD  HYDROcodone-acetaminophen (NORCO/VICODIN) 5-325 MG tablet Take 1-2 tablets by mouth every 6 (six) hours as needed for moderate pain. 08/29/16   Albertine GratesXu, Fang, MD  pregabalin (LYRICA) 25 MG capsule TAKE 1-2 CAPSULES BY MOUTH EVERY 8 HOURS AS NEEDED FOR NERVE PAIN 03/13/14   [provider]    Family History Family History  Problem Relation Age of Onset  . CAD Mother   . Lung cancer Mother   . Hypertension Brother   . Hypertension Other   . Diabetes Neg Hx     Social History Social History  Substance Use Topics  . Smoking status: Never Smoker  . Smokeless tobacco: Never Used  . Alcohol use No     Allergies   Patient has no known allergies.   Review of Systems Review of Systems Ten systems reviewed and are negative for acute change, except as noted in the HPI.    Physical Exam Updated Vital Signs BP (!) 154/71 (BP Location: Left Arm)   Pulse 71   Temp 97.6 F (36.4 C) (Oral)   Resp 19   SpO2 100%   Physical Exam  Constitutional: She is oriented to person, place, and time. She appears well-developed and well-nourished. No distress.  Nontoxic and in NAD  HENT:  Head: Normocephalic and atraumatic.  Eyes: Conjunctivae and EOM are normal. No scleral icterus.  Neck: Normal range of motion.  Cardiovascular: Normal rate, regular rhythm and intact distal pulses.   DP and PT pulse 2+ in the RLE  Pulmonary/Chest: Effort normal. No respiratory distress.  Respirations even and unlabored  Musculoskeletal: Normal range of motion.  Preserved ROM of the RLE. Good right hip flexion. Prior surgical site is C/D/I; no erythema  or heat to touch. Mild muscular tenderness. No bony tenderness or crepitus.  Neurological: She is alert and oriented to person, place, and time. She exhibits normal muscle tone. Coordination normal.  Sensation to light touch intact in the RLE  Skin: Skin is warm and dry. No rash noted. She is not diaphoretic. No erythema. No pallor.  Psychiatric: She has a normal mood and affect. Her behavior is normal.  Nursing note and vitals reviewed.    ED Treatments / Results  Labs (all labs ordered are listed, but only abnormal results are displayed) Labs Reviewed - No data to display  EKG  EKG Interpretation None       Radiology No results found.      Procedures Procedures (including critical care time)  Medications Ordered in ED Medications - No data to display   Initial Impression / Assessment and Plan / ED Course  I  have reviewed the triage vital signs and the nursing notes.  Pertinent labs & imaging results that were available during my care of the patient were reviewed by me and considered in my medical decision making (see chart for details).     83 year old female status post ORIF of the right femur in 08/2016 presents to the emergency department from her rehab facility over concern for periprosthetic fracture. She had an x-ray today after persistent right hip discomfort which showed an age indeterminate fracture 7 cm from the lesser trochanter.   I have reviewed patient's old imaging. Site of her fracture in March was at the location of her fracture seen on imaging today. No evidence of acute fracture, per imaging read from today (see above). Patient neurovascularly intact with preserved range of motion of her right lower extremity. I believe that her pain is due to muscle strain from aggressive rehabilitation. I do not believe further emergent workup or imaging is indicated. The patient has been advised to follow-up with her orthopedic surgeon as needed. Return precautions  discussed and provided. Patient discharged in stable condition. Patient and daughter with no unaddressed concerns.   Final Clinical Impressions(s) / ED Diagnoses   Final diagnoses:  Hip strain, right, initial encounter    New Prescriptions New Prescriptions   No medications on file     Antony Madura, Cordelia Poche 11/10/16 2359    Pricilla Loveless, MD 11/16/16 [phone]

## 2016-11-10 NOTE — Discharge Instructions (Signed)
Continue with physical therapy, but do not push past your limitation to cause severe pain. Continue follow-up with your orthopedist as needed. You may return to the emergency department for new or concerning symptoms.

## 2016-11-10 NOTE — ED Notes (Signed)
Daughter at bedside.

## 2016-11-10 NOTE — ED Triage Notes (Signed)
Pt was in rehab for a previous right hip fracture, she started to complain of right hip pain today after therapy, the in house xray showed no acute fracture but sis show the existing hardware from the previous femur fracture

## 2016-11-11 NOTE — ED Notes (Signed)
Phone report given to Monica Caldwell at [email or web address]

## 2016-11-11 NOTE — ED Notes (Signed)
[facility] called for transportation  

## 2016-12-20 DIAGNOSIS — N189 Chronic kidney disease, unspecified: Secondary | ICD-10-CM | POA: Insufficient documentation

## 2016-12-20 DIAGNOSIS — I10 Essential (primary) hypertension: Secondary | ICD-10-CM | POA: Insufficient documentation

## 2016-12-20 DIAGNOSIS — M1A0720 Idiopathic chronic gout, left ankle and foot, without tophus (tophi): Secondary | ICD-10-CM | POA: Insufficient documentation

## 2017-06-22 ENCOUNTER — Ambulatory Visit: Payer: Medicare Other | Admitting: Family Medicine

## 2017-06-27 ENCOUNTER — Other Ambulatory Visit: Payer: Self-pay | Admitting: *Deleted

## 2017-06-29 ENCOUNTER — Encounter: Payer: Self-pay | Admitting: Family Medicine

## 2017-06-29 ENCOUNTER — Ambulatory Visit (INDEPENDENT_AMBULATORY_CARE_PROVIDER_SITE_OTHER): Payer: Medicare Other | Admitting: Family Medicine

## 2017-06-29 ENCOUNTER — Other Ambulatory Visit: Payer: Self-pay | Admitting: Family Medicine

## 2017-06-29 VITALS — BP 120/78 | HR 70 | Temp 97.7°F | Ht 61.0 in | Wt 130.0 lb

## 2017-06-29 DIAGNOSIS — M159 Polyosteoarthritis, unspecified: Secondary | ICD-10-CM | POA: Insufficient documentation

## 2017-06-29 DIAGNOSIS — I5032 Chronic diastolic (congestive) heart failure: Secondary | ICD-10-CM

## 2017-06-29 DIAGNOSIS — J4540 Moderate persistent asthma, uncomplicated: Secondary | ICD-10-CM | POA: Insufficient documentation

## 2017-06-29 DIAGNOSIS — Z8781 Personal history of (healed) traumatic fracture: Secondary | ICD-10-CM

## 2017-06-29 DIAGNOSIS — Z23 Encounter for immunization: Secondary | ICD-10-CM | POA: Diagnosis not present

## 2017-06-29 DIAGNOSIS — M150 Primary generalized (osteo)arthritis: Secondary | ICD-10-CM

## 2017-06-29 DIAGNOSIS — I10 Essential (primary) hypertension: Secondary | ICD-10-CM | POA: Diagnosis not present

## 2017-06-29 DIAGNOSIS — M810 Age-related osteoporosis without current pathological fracture: Secondary | ICD-10-CM

## 2017-06-29 DIAGNOSIS — G629 Polyneuropathy, unspecified: Secondary | ICD-10-CM | POA: Insufficient documentation

## 2017-06-29 DIAGNOSIS — N182 Chronic kidney disease, stage 2 (mild): Secondary | ICD-10-CM

## 2017-06-29 HISTORY — DX: Personal history of (healed) traumatic fracture: Z87.81

## 2017-06-29 LAB — LIPID PANEL
CHOL/HDL RATIO: 3
CHOLESTEROL: 165 mg/dL (ref 0–200)
HDL: 49.9 mg/dL (ref >39.00)
LDL CALC: 98 mg/dL (ref 0–99)
NonHDL: 115.52
TRIGLYCERIDES: 88 mg/dL (ref 0.0–149.0)
VLDL: 17.6 mg/dL (ref 0.0–40.0)

## 2017-06-29 LAB — CBC WITH DIFFERENTIAL/PLATELET
BASOS PCT: 1.3 % (ref 0.0–3.0)
Basophils Absolute: 0.1 10*3/uL (ref 0.0–0.1)
EOS ABS: 0.5 10*3/uL (ref 0.0–0.7)
EOS PCT: 6.9 % — AB (ref 0.0–5.0)
HEMATOCRIT: 40.1 % (ref 36.0–46.0)
HEMOGLOBIN: 13.3 g/dL (ref 12.0–15.0)
LYMPHS PCT: 35.2 % (ref 12.0–46.0)
Lymphs Abs: 2.7 10*3/uL (ref 0.7–4.0)
MCHC: 33.1 g/dL (ref 30.0–36.0)
MCV: 89.1 fl (ref 78.0–100.0)
MONO ABS: 0.6 10*3/uL (ref 0.1–1.0)
Monocytes Relative: 8.4 % (ref 3.0–12.0)
Neutro Abs: 3.7 10*3/uL (ref 1.4–7.7)
Neutrophils Relative %: 48.2 % (ref 43.0–77.0)
Platelets: 325 10*3/uL (ref 150.0–400.0)
RBC: 4.5 Mil/uL (ref 3.87–5.11)
RDW: 13.7 % (ref 11.5–15.5)
WBC: 7.6 10*3/uL (ref 4.0–10.5)

## 2017-06-29 LAB — COMPREHENSIVE METABOLIC PANEL
ALBUMIN: 4.3 g/dL (ref 3.5–5.2)
ALK PHOS: 56 U/L (ref 39–117)
ALT: 10 U/L (ref 0–35)
AST: 16 U/L (ref 0–37)
BUN: 43 mg/dL — AB (ref 6–23)
CALCIUM: 10 mg/dL (ref 8.4–10.5)
CHLORIDE: 98 meq/L (ref 96–112)
CO2: 30 mEq/L (ref 19–32)
Creatinine, Ser: 1.68 mg/dL — ABNORMAL HIGH (ref 0.40–1.20)
GFR: 30.65 mL/min — ABNORMAL LOW (ref >60.00)
Glucose, Bld: 91 mg/dL (ref 70–99)
POTASSIUM: 3.4 meq/L — AB (ref 3.5–5.1)
SODIUM: 141 meq/L (ref 135–145)
TOTAL PROTEIN: 7.4 g/dL (ref 6.0–8.3)
Total Bilirubin: 0.7 mg/dL (ref 0.2–1.2)

## 2017-06-29 MED ORDER — DILTIAZEM HCL ER COATED BEADS 120 MG PO CP24: 120.0000 mg | ORAL_CAPSULE | Freq: Every day | ORAL | 5 refills | Status: DC

## 2017-06-29 NOTE — Progress Notes (Signed)
Please call patient: I have reviewed his/her lab results. Labs confirm a mildly low potassium level and mild kidney disease (but stable). I recommend stopping the maxzide as we discussed and I have ordered cardizem for blood pressure control instead. Continue the lasix as written. Everything else looks good.

## 2017-06-29 NOTE — Progress Notes (Signed)
Subjective  CC:  Chief Complaint  Patient presents with  . Establish Care    HPI: Monica Caldwell is a 84 y.o. female who presents to [facility] today to establish care with me as a new patient. She is Monica AntonDonna Caldwell' mother. Prior care with [facility]. I have reviewed old records form 2014 onwards. She has the following concerns or needs:   HTN f/u: on maxzide for bp control AND lasix for diastolic CHF and LE edema. Last labs reviewed 12/2016. Potassium was normal but elevated creatinine. Had been on metoprolol but stopped due to fatigue: pt self restarted the maxzide.   Diastolic chf, compensated; last echo 2018 with nl EF. Reviewed cards consult 08/2016 with elevated bnp normalized with lasix. Pt reports she is stable w/o sob, doe, cp or LE edema.   H/o gout; rare flares. Elevated UA but does not want preventative meds.   Asthma on chronic advair with rare albuterol use.   CKD - new last year. 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   Osteoporosis formerly on fosamax for about a decade d/c's 08/2016 after atypical fracture of femur. Last dexa was "many years ago". On calcium and vit D with recent nl Vit D levels.   High fall risk  Peripheral neuropathy formerly on lyrica but pt self d/c'd - no complaints of pain now.   Pt lives mostly independently with daughter living upstairs. Performs all ADLs. Last awv 12/2016. No mood or cognitive concerns.  We updated and reviewed the patient's past history in detail and it is documented below.  Patient Active Problem List   Diagnosis Date Noted  . Moderate persistent asthma 06/29/2017    Priority: High  . Hypertension, essential, benign 12/20/2016    Priority: High  . CKD (chronic kidney disease) 12/20/2016    Priority: High  . Diastolic CHF, chronic (HCC) 09/22/2016    Priority: High  . Osteoporosis 12/21/2011    Priority: High  . Mixed hyperlipidemia 12/21/2011    Priority: High  .  History of femur fracture 06/29/2017    Priority: Medium  . Peripheral neuropathy 06/29/2017    Priority: Medium  . Chronic gout of left foot 12/20/2016    Priority: Medium  . Osteoarthritis, multiple sites     Priority: Low  . RBBB 09/22/2016    Priority: Low  . Vitamin D deficiency 12/21/2011    Priority: Low   Health Maintenance  Topic Date Due  . DEXA SCAN  10/12/1995  . INFLUENZA VACCINE  Completed  . PNA vac Low Risk Adult  Completed  . TETANUS/TDAP  Discontinued   Immunization History  Administered Date(s) Administered  . Influenza, High Dose Seasonal PF 03/06/2017  . Pneumococcal Polysaccharide-23 06/29/2017   Current Meds  Medication Sig  . furosemide (LASIX) 20 MG tablet Take 20 mg by mouth daily.   Marland Kitchen. triamterene-hydrochlorothiazide (MAXZIDE-25) 37.5-25 MG tablet Take 1 tablet by mouth daily.     Allergies: Patient has No Known Allergies. Past Medical History Patient  has a past medical history of Arthritis, Asthma, Closed fracture of right distal radius and ulna (01/11/2013), History of femur fracture (06/29/2017), Hypertension, Osteoarthritis, multiple sites, Osteoporosis, and Wears dentures. Past Surgical History Patient  has a past surgical history that includes Kidney stone surgery; Tubal ligation; Colonoscopy; Open reduction internal fixation (orif) distal radial fracture (Right, 01/11/2013); Femur IM nail (Right, 08/27/2016); and Cholecystectomy (1990). Family History: Patient family history includes CAD in her mother; COPD in her brother and  mother; Cancer in her mother; Heart attack in her brother; Heart disease in her brother; Hyperlipidemia in her brother; Hypertension in her brother; Lung cancer in her mother. Social History:  Patient  reports that  has never smoked. she has never used smokeless tobacco. She reports that she does not drink alcohol or use drugs.  Review of Systems: Constitutional: negative for fever or malaise Ophthalmic: negative for  photophobia, double vision or loss of vision Cardiovascular: negative for chest pain, dyspnea on exertion, or new LE swelling Respiratory: negative for SOB or persistent cough Gastrointestinal: negative for abdominal pain, change in bowel habits or melena Genitourinary: negative for dysuria or gross hematuria Musculoskeletal: negative for new gait disturbance or muscular weakness, + unsteady gait uses cane Integumentary: negative for new or persistent rashes Neurological: negative for TIA or stroke symptoms Psychiatric: negative for SI or delusions Allergic/Immunologic: negative for hives  Patient Care Team    Relationship Specialty Notifications Start End  Willow Ora, MD PCP - General Family Medicine  06/29/17   Samson Frederic, MD Consulting Physician Orthopedic Surgery  06/29/17   Tonna Corner, MD Consulting Physician Cardiology  06/29/17     Objective  Vitals: BP 120/78 (BP Location: Left Arm, Patient Position: Sitting, Cuff Size: Normal)   Pulse 70   Temp 97.7 F (36.5 C) (Oral)   Ht 5\' 1"  (1.549 m)   Wt 130 lb (59 kg)   BMI 24.56 kg/m  General:  no acute distress, thin, kyphotic, moves slowly; gets to exam table with assitance  Psych:  Alert and oriented x 3,normal mood and affect HEENT:  Normocephalic, atraumatic, non-icteric sclera, PERRL, oropharynx is without mass or exudate, supple neck without adenopathy, mass or thyromegaly Cardiovascular:  RRR without gallop, rub or murmur, +tr pitting edema BLE Respiratory:  Good breath sounds bilaterally, CTAB with normal respiratory effort Gastrointestinal: normal bowel sounds, soft, non-tender, no noted masses. No HSM MSK: no deformities, contusions.  Skin:  Warm, no rashes or suspicious lesions noted Neurologic:    Mental status is normal. Unsteady gait. No tremor  Assessment  1. Essential hypertension   2. Age-related osteoporosis without current pathological fracture   3. Primary osteoarthritis involving multiple  joints   4. Diastolic CHF, chronic (HCC)   5. Stage 2 chronic kidney disease   6. Moderate persistent asthma without complication      Plan  Hypertension follow up: good control but on two diuretics. Check renal and electrolytes and adjust bp meds afterwards. Consider diltiazem.  Osteoporosis: overdue for dexa; ordered. Continue vit D and calcium. High risk for fracture if T=-2.5; consider Prolia if indicated. No biphosphonates.  CHF well compensated. Continue daily lasix, low salt diet.  Monitor renal function.  Asthma is controlled per patient.  Pneumovax updated today  Follow up:  Return in about 6 months (around 12/27/2017) for follow up Hypertension. awv in July 2019  Commons side effects, risks, benefits, and alternatives for medications and treatment plan prescribed today were discussed, and the patient expressed understanding of the given instructions. Patient is instructed to call or message via MyChart if he/she has any questions or concerns regarding our treatment plan. No barriers to understanding were identified. We discussed Red Flag symptoms and signs in detail. Patient expressed understanding regarding what to do in case of urgent or emergency type symptoms.   Medication list was reconciled, printed and provided to the patient in AVS. Patient instructions and summary information was reviewed with the patient as documented in the AVS.  This note was prepared with assistance of Conservation officer, historic buildings. Occasional wrong-word or sound-a-like substitutions may have occurred due to the inherent limitations of voice recognition software  Orders Placed This Encounter  Procedures  . DG Bone Density  . Pneumococcal polysaccharide vaccine 23-valent greater than or equal to 2yo subcutaneous/IM  . Comprehensive metabolic panel  . CBC with Differential/Platelet  . Lipid panel   No orders of the defined types were placed in this encounter.

## 2017-06-29 NOTE — Patient Instructions (Signed)
Please return in 6 months for blood pressure check  It was a pleasure meeting you today! Thank you for choosing [place] to meet your healthcare needs! I truly look forward to working with you. If you have any questions or concerns, please send me a message via Mychart or call the office at [phone].  Medicare recommends an Annual Wellness Visit for all patients. Please schedule this to be done with our Nurse Educator, Maudie Mercury in July 2019. This is an informative "talk" visit; it's goals are to ensure that your health care needs are being met and to give you education regarding avoiding falls, ensuring you are not suffering from depression or problems with memory or thinking, and to educate you on Advance Care Planning. It helps me take good care of you!

## 2017-07-14 LAB — HM DEXA SCAN

## 2017-07-18 ENCOUNTER — Telehealth: Payer: Self-pay | Admitting: Family Medicine

## 2017-07-18 ENCOUNTER — Encounter: Payer: Self-pay | Admitting: Emergency Medicine

## 2017-07-18 NOTE — Telephone Encounter (Signed)
Please call patient and notify her that her bone density results show worsening osteoporosis.   We can recommend therapies to help reverse this and prevent future fractures. Please schedule an office visit at her convenience to discuss her options of treatment since she is not a candidate for biphosphonates (like fosamax).   Thanks.

## 2017-07-18 NOTE — Telephone Encounter (Signed)
Patient's [place] informed, [place]. Appt made of 08/04/17 to discuss therapy options for osteoporosis. AP, CMA

## 2017-08-04 ENCOUNTER — Encounter: Payer: Self-pay | Admitting: Family Medicine

## 2017-08-04 ENCOUNTER — Ambulatory Visit (INDEPENDENT_AMBULATORY_CARE_PROVIDER_SITE_OTHER): Payer: Medicare Other | Admitting: Family Medicine

## 2017-08-04 ENCOUNTER — Other Ambulatory Visit: Payer: Self-pay

## 2017-08-04 VITALS — BP 124/78 | HR 68 | Temp 97.7°F | Ht 61.0 in | Wt 146.8 lb

## 2017-08-04 DIAGNOSIS — N182 Chronic kidney disease, stage 2 (mild): Secondary | ICD-10-CM

## 2017-08-04 DIAGNOSIS — I5032 Chronic diastolic (congestive) heart failure: Secondary | ICD-10-CM

## 2017-08-04 DIAGNOSIS — I10 Essential (primary) hypertension: Secondary | ICD-10-CM | POA: Diagnosis not present

## 2017-08-04 DIAGNOSIS — E559 Vitamin D deficiency, unspecified: Secondary | ICD-10-CM | POA: Diagnosis not present

## 2017-08-04 DIAGNOSIS — M810 Age-related osteoporosis without current pathological fracture: Secondary | ICD-10-CM | POA: Diagnosis not present

## 2017-08-04 LAB — BASIC METABOLIC PANEL
BUN: 38 mg/dL — AB (ref 6–23)
CALCIUM: 9.7 mg/dL (ref 8.4–10.5)
CO2: 31 mEq/L (ref 19–32)
CREATININE: 1.34 mg/dL — AB (ref 0.40–1.20)
Chloride: 104 mEq/L (ref 96–112)
GFR: 39.78 mL/min — AB (ref >60.00)
Glucose, Bld: 75 mg/dL (ref 70–99)
Potassium: 4.1 mEq/L (ref 3.5–5.1)
Sodium: 144 mEq/L (ref 135–145)

## 2017-08-04 LAB — VITAMIN D 25 HYDROXY (VIT D DEFICIENCY, FRACTURES): VITD: 53.3 ng/mL (ref 30.00–100.00)

## 2017-08-04 MED ORDER — TRIAMTERENE-HCTZ 37.5-25 MG PO TABS: 1.0000 | ORAL_TABLET | Freq: Every day | ORAL | Status: DC

## 2017-08-04 MED ORDER — POTASSIUM CHLORIDE CRYS ER 10 MEQ PO TBCR: 10.0000 meq | EXTENDED_RELEASE_TABLET | Freq: Every day | ORAL | 2 refills | Status: DC

## 2017-08-04 NOTE — Progress Notes (Signed)
Subjective  CC:  Chief Complaint  Patient presents with  . Follow-up    Discuss Osteoporosis Treatment  . Edema    Swelling in legs and Ankles   Wt Readings from Last 3 Encounters:  08/04/17 146 lb 12.8 oz (66.6 kg)  06/29/17 130 lb (59 kg)  09/22/16 163 lb (73.9 kg)    HPI: Monica Caldwell is a 84 y.o. female who presents to the office today to address the problems listed above in the chief complaint.  Here for follow-up of osteoporosis.  Reviewed bone density test results that showed  T score = - 3.1 and worse from last testing.  Fosamax and bisphosphonates are contraindicated due to atypical femur fracture history.  Discussed other options.  Calcium was normal when most recently tested.  Due for vitamin D test.  Of note she has been off her vitamin D supplementation.  Diastolic CHF with worsening lower extremity bilateral edema over the last 2 weeks.  This concurred with stopping Maxide and changing till diltiazem.  Weight is up as shown above.  No chest pain or shortness of breath.  She had been on both Maxide and Lasix together.  I stopped Maxide and changed to diltiazem.  This is the cause of her worsening edema.  Her most recent renal function was mildly elevated with a GFR in the 30s. Lab Results  Component Value Date   CREATININE 1.68 (H) 06/29/2017   CREATININE 0.75 08/30/2016   CREATININE 0.75 08/29/2016    I reviewed the patients updated PMH, FH, and SocHx.    Patient Active Problem List   Diagnosis Date Noted  . Moderate persistent asthma 06/29/2017    Priority: High  . Hypertension, essential, benign 12/20/2016    Priority: High  . CKD (chronic kidney disease) 12/20/2016    Priority: High  . Diastolic CHF, chronic (HCC) 09/22/2016    Priority: High  . Osteoporosis 12/21/2011    Priority: High  . Mixed hyperlipidemia 12/21/2011    Priority: High  . History of femur fracture 06/29/2017    Priority: Medium  . Peripheral neuropathy 06/29/2017   Priority: Medium  . Chronic gout of left foot 12/20/2016    Priority: Medium  . Osteoarthritis, multiple sites     Priority: Low  . RBBB 09/22/2016    Priority: Low  . Vitamin D deficiency 12/21/2011    Priority: Low   Current Meds  Medication Sig  . albuterol (PROAIR HFA) 108 (90 Base) MCG/ACT inhaler INHALE 2 PUFFS BY MOUTH EVERY 4 HOURS AS NEEDED FOR RESCUE FROM WHEEZING OR SHORTNESS OF BREATH  . diltiazem (CARDIZEM CD) 120 MG 24 hr capsule Take 1 capsule (120 mg total) by mouth daily.  . fluticasone-salmeterol (ADVAIR HFA) 115-21 MCG/ACT inhaler Inhale 2 puffs into the lungs 2 (two) times daily.  . furosemide (LASIX) 20 MG tablet Take 20 mg by mouth daily.   . indomethacin (INDOCIN) 25 MG capsule Take 25 mg by mouth as needed.    Allergies: Patient has No Known Allergies. Family History: Patient family history includes CAD in her mother; COPD in her brother and mother; Cancer in her mother; Heart attack in her brother; Heart disease in her brother; Hyperlipidemia in her brother; Hypertension in her brother; Lung cancer in her mother. Social History:  Patient  reports that  has never smoked. she has never used smokeless tobacco. She reports that she does not drink alcohol or use drugs.  Review of Systems: Constitutional: Negative for fever malaise or  anorexia  Cardiovascular: negative for chest pain Respiratory: negative for SOB or persistent cough Gastrointestinal: negative for abdominal pain  Objective  Vitals: BP 124/78   Pulse 68   Temp 97.7 F (36.5 C)   Ht 5\' 1"  (1.549 m)   Wt 146 lb 12.8 oz (66.6 kg)   BMI 27.74 kg/m  General: no acute distress , A&Ox3 HEENT: PEERL, conjunctiva normal, Oropharynx moist,neck is supple Cardiovascular:  RRR without murmur or gallop.  +3 pitting edema to knees bilaterally no cords Respiratory:  Good breath sounds bilaterally, CTAB with normal respiratory effort Skin:  Warm, erythematous bilaterally to knees, no  weeping  Assessment  1. Age-related osteoporosis without current pathological fracture   2. Vitamin D deficiency   3. Diastolic CHF, chronic (HCC)   4. Stage 2 chronic kidney disease   5. Hypertension, essential, benign      Plan   Osteoporosis: Counseling done regarding different options of treatment.  Elect Prolia injections every 6 months.  Counseling done on need to maintain treatment regularly to avoid increased risk of fractures.  Check vitamin D levels.  Supplement prior to starting Prolia if needed.  Diastolic heart failure and lower extremity edema: Ideally would like to use furosemide, however patient preference is backside.  Will reinstitute Maxide together with Lasix over the next 2 weeks.  Then recheck.  Supplement potassium low dose.  Check lab work today and then again in 2 weeks.  If not improving, will change to increased dose of Lasix only.  Monitor blood pressure, call if symptoms of low pressure develop.  May need to cut back on Delsym channel blocker as well.  Hypertension controlled today.  Monitor for low blood pressure with the addition of Maxide.  Patient aware  Follow up: Return in about 2 weeks (around 08/18/2017) for recheck, result review.    Commons side effects, risks, benefits, and alternatives for medications and treatment plan prescribed today were discussed, and the patient expressed understanding of the given instructions. Patient is instructed to call or message via MyChart if he/she has any questions or concerns regarding our treatment plan. No barriers to understanding were identified. We discussed Red Flag symptoms and signs in detail. Patient expressed understanding regarding what to do in case of urgent or emergency type symptoms.   Medication list was reconciled, printed and provided to the patient in AVS. Patient instructions and summary information was reviewed with the patient as documented in the AVS. This note was prepared with assistance of  Dragon voice recognition software. Occasional wrong-word or sound-a-like substitutions may have occurred due to the inherent limitations of voice recognition software  Orders Placed This Encounter  Procedures  . Vitamin D (25 hydroxy)  . Basic Metabolic Panel (BMET)   Meds ordered this encounter  Medications  . triamterene-hydrochlorothiazide (MAXZIDE-25) 37.5-25 MG tablet    Sig: Take 1 tablet by mouth daily.  . potassium chloride SA (K-DUR,KLOR-CON) 10 MEQ tablet    Sig: Take 1 tablet (10 mEq total) by mouth daily.    Dispense:  30 tablet    Refill:  2

## 2017-08-04 NOTE — Patient Instructions (Addendum)
Please return in July for your complete physical and AWV with Selena Batten.  Return in 2 weeks for recheck.   Restart the maxzide and continue your lasix 20mg  daily. Continue your potassium supplement as well.   If you have any questions or concerns, please don't hesitate to send me a message via MyChart or call the office at [phone]. Thank you for visiting with [place] today! It's our pleasure caring for you.   Calcium Intake Recommendations You can take Caltrate Plus twice a day or get it through your diet or other OTC supplements (Viactiv, OsCal etc)  Calcium is a mineral that affects many functions in the body, including:  Blood clotting.  Blood vessel function.  Nerve impulse conduction.  Hormone secretion.  Muscle contraction.  Bone and teeth functions.  Most of your body's calcium supply is stored in your bones and teeth. When your calcium stores are low, you may be at risk for low bone mass, bone loss, and bone fractures. Consuming enough calcium helps to grow healthy bones and teeth and to prevent breakdown over time. It is very important that you get enough calcium if you are:  A child undergoing rapid growth.  An adolescent girl.  A pre- or post-menopausal woman.  A woman whose menstrual cycle has stopped due to anorexia nervosa or regular intense exercise.  An individual with lactose intolerance or a milk allergy.  A vegetarian.  What is my plan? Try to consume the recommended amount of calcium daily based on your age. Depending on your overall health, your health care provider may recommend increased calcium intake.General daily calcium intake recommendations by age are:  Birth to 6 months: 200 mg.  Infants 7 to 12 months: 260 mg.  Children 1 to 3 years: 700 mg.  Children 4 to 8 years: 1,000 mg.  Children 9 to 13 years: 1,300 mg.  Teens 14 to 18 years: 1,300 mg.  Adults 19 to 50 years: 1,000 mg.  Adult women 51 to 70 years: 1,200 mg.  Adult men 51 to 70  years: 1,000 mg.  Adults 71 years and older: 1,200 mg.  Pregnant and breastfeeding teens: 1,300 mg.  Pregnant and breastfeeding adults: 1,000 mg.  What do I need to know about calcium intake?  In order for the body to absorb calcium, it needs vitamin D. You can get vitamin D through (we recommend getting [phone] units of Vitamin D daily) ? Direct exposure of the skin to sunlight. ? Foods, such as egg yolks, liver, saltwater fish, and fortified milk. ? Supplements.  Consuming too much calcium may cause: ? Constipation. ? Decreased absorption of iron and zinc. ? Kidney stones.  Calcium supplements may interact with certain medicines. Check with your health care provider before starting any calcium supplements.  Try to get most of your calcium from food. What foods can I eat? Grains  Fortified oatmeal. Fortified ready-to-eat cereals. Fortified frozen waffles. Vegetables Turnip greens. Broccoli. Fruits Fortified orange juice. Meats and Other Protein Sources Canned sardines with bones. Canned salmon with bones. Soy beans. Tofu. Baked beans. Almonds. [place] nuts. Sunflower seeds. Dairy Milk. Yogurt. Cheese. Cottage cheese. Beverages Fortified soy milk. Fortified rice milk. Sweets/Desserts Pudding. Ice Cream. Milkshakes. Blackstrap molasses. The items listed above may not be a complete list of recommended foods or beverages. Contact your dietitian for more options. What foods can affect my calcium intake? It may be more difficult for your body to use calcium or calcium may leave your body more quickly if you  consume large amounts of:  Sodium.  Protein.  Caffeine.  Alcohol.  This information is not intended to replace advice given to you by your health care provider. Make sure you discuss any questions you have with your health care provider. Document Released: 01/05/2004 Document Revised: 12/11/2015 Document Reviewed: 10/29/2013 Elsevier Interactive Patient Education   2018 [employer]   Osteoporosis Osteoporosis happens when your bones become thinner and weaker. Weak bones can break (fracture) more easily when you slip or fall. Bones most at risk of breaking are in the hip, wrist, and spine. Follow these instructions at home:  Get enough calcium and vitamin D. These nutrients are good for your bones.  Exercise as told by your doctor.  Do not use any tobacco products. This includes cigarettes, chewing tobacco, and electronic cigarettes. If you need help quitting, ask your doctor.  Limit the amount of alcohol you drink.  Take medicines only as told by your doctor.  Keep all follow-up visits as told by your doctor. This is important.  Take care at home to prevent falls. Some ways to do this are: ? Keep rooms well lit and tidy. ? Put safety rails on your stairs. ? Put a rubber mat in the bathroom and other places that are often wet or slippery. Get help right away if:  You fall.  You hurt yourself. This information is not intended to replace advice given to you by your health care provider. Make sure you discuss any questions you have with your health care provider. Document Released: 08/15/2011 Document Revised: 10/29/2015 Document Reviewed: 10/31/2013 Elsevier Interactive Patient Education  [employer]2018 [employer]vier Inc.

## 2017-08-11 ENCOUNTER — Encounter: Payer: Self-pay | Admitting: Family Medicine

## 2017-08-11 NOTE — Telephone Encounter (Signed)
Dr. Mardelle MatteAndy,   Please see My chart message and advise

## 2017-08-18 ENCOUNTER — Ambulatory Visit (INDEPENDENT_AMBULATORY_CARE_PROVIDER_SITE_OTHER): Payer: Medicare Other | Admitting: Family Medicine

## 2017-08-18 ENCOUNTER — Encounter: Payer: Self-pay | Admitting: Family Medicine

## 2017-08-18 ENCOUNTER — Ambulatory Visit: Payer: Medicare Other | Admitting: Family Medicine

## 2017-08-18 ENCOUNTER — Other Ambulatory Visit: Payer: Self-pay

## 2017-08-18 VITALS — BP 124/80 | HR 61 | Temp 97.7°F | Resp 15 | Ht 61.0 in | Wt 134.6 lb

## 2017-08-18 DIAGNOSIS — M810 Age-related osteoporosis without current pathological fracture: Secondary | ICD-10-CM

## 2017-08-18 DIAGNOSIS — I10 Essential (primary) hypertension: Secondary | ICD-10-CM

## 2017-08-18 DIAGNOSIS — G6289 Other specified polyneuropathies: Secondary | ICD-10-CM

## 2017-08-18 DIAGNOSIS — I5032 Chronic diastolic (congestive) heart failure: Secondary | ICD-10-CM | POA: Diagnosis not present

## 2017-08-18 DIAGNOSIS — N182 Chronic kidney disease, stage 2 (mild): Secondary | ICD-10-CM | POA: Diagnosis not present

## 2017-08-18 LAB — BASIC METABOLIC PANEL
BUN: 40 mg/dL — AB (ref 6–23)
CALCIUM: 9.9 mg/dL (ref 8.4–10.5)
CHLORIDE: 101 meq/L (ref 96–112)
CO2: 36 mEq/L — ABNORMAL HIGH (ref 19–32)
CREATININE: 1.51 mg/dL — AB (ref 0.40–1.20)
GFR: 34.66 mL/min — AB (ref >60.00)
Glucose, Bld: 98 mg/dL (ref 70–99)
Potassium: 4.1 mEq/L (ref 3.5–5.1)
Sodium: 142 mEq/L (ref 135–145)

## 2017-08-18 MED ORDER — POTASSIUM CHLORIDE CRYS ER 10 MEQ PO TBCR: 20.0000 meq | EXTENDED_RELEASE_TABLET | ORAL | Status: DC

## 2017-08-18 MED ORDER — DENOSUMAB 60 MG/ML ~~LOC~~ SOLN
60.0000 mg | Freq: Once | SUBCUTANEOUS | Status: AC
  Administered 2017-08-18: 60 mg via SUBCUTANEOUS

## 2017-08-18 NOTE — Progress Notes (Signed)
Subjective  CC:  Chief Complaint  Patient presents with  . Follow-up    Lasix is working, fibromyalgia is hurting, has taken some Lyrica but needs an Rx    HPI: Monica Caldwell is a 84 y.o. female who presents to the office today to address the problems listed above in the chief complaint.  Diastolic CHF: Here for follow-up of diastolic heart failure and lower extremity edema.  Patient's weight is down 12 pounds in the last 2 weeks on increased dose of twice daily Lasix with Maxide.  Patient's daughter reports that lower extremities are still swollen but are improved after she elevates legs.  She is not wearing compression hose.  She is working on decreasing sodium in her diet.  She has had no chest pain or shortness of breath.  No orthopnea. Lab Results  Component Value Date   CREATININE 1.34 (H) 08/04/2017   BUN 38 (H) 08/04/2017   NA 144 08/04/2017   K 4.1 08/04/2017   CL 104 08/04/2017   CO2 31 08/04/2017    Head pain: Patient reports remote history of acute diffuse bitemporal electric-like pain that came on suddenly and intermittently.  This started about 10 years ago.  At that time, they report having a full neurologic workup including a normal brain MRI, other tests.  I do not have the records for this.  She reports she did see a neurologist.  She says her primary care doctor at that time called it fibromyalgia and put her on as needed Lyrica.  Over the last 10 years she has had only a handful of these episodes, the last being within the last 2 weeks.  She reports it comes on suddenly last for about 10 seconds is moderate to severe and resolves spontaneously.  She has no other pain.  She denies loss of vision or loss of consciousness.  No aura.  She has a remote history of migraines as a child.  She feels well now.  Wt Readings from Last 3 Encounters:  08/18/17 134 lb 9.6 oz (61.1 kg)  08/04/17 146 lb 12.8 oz (66.6 kg)  06/29/17 130 lb (59 kg)   BP Readings from Last 3  Encounters:  08/18/17 124/80  08/04/17 124/78  06/29/17 120/78    I reviewed the patients updated PMH, FH, and SocHx.    Patient Active Problem List   Diagnosis Date Noted  . Moderate persistent asthma 06/29/2017    Priority: High  . Hypertension, essential, benign 12/20/2016    Priority: High  . CKD (chronic kidney disease) 12/20/2016    Priority: High  . Diastolic CHF, chronic (HCC) 09/22/2016    Priority: High  . Osteoporosis 12/21/2011    Priority: High  . Mixed hyperlipidemia 12/21/2011    Priority: High  . History of femur fracture 06/29/2017    Priority: Medium  . Peripheral neuropathy 06/29/2017    Priority: Medium  . Chronic gout of left foot 12/20/2016    Priority: Medium  . Osteoarthritis, multiple sites     Priority: Low  . RBBB 09/22/2016    Priority: Low  . Vitamin D deficiency 12/21/2011    Priority: Low   Current Meds  Medication Sig  . albuterol (PROAIR HFA) 108 (90 Base) MCG/ACT inhaler INHALE 2 PUFFS BY MOUTH EVERY 4 HOURS AS NEEDED FOR RESCUE FROM WHEEZING OR SHORTNESS OF BREATH  . diltiazem (CARDIZEM CD) 120 MG 24 hr capsule Take 1 capsule (120 mg total) by mouth daily.  . fluticasone-salmeterol (ADVAIR  HFA) 115-21 MCG/ACT inhaler Inhale 2 puffs into the lungs 2 (two) times daily.  . furosemide (LASIX) 20 MG tablet Take 40 mg by mouth every morning. Also takes and additional 20mg  at night. (60 mg Daily)  . indomethacin (INDOCIN) 25 MG capsule Take 25 mg by mouth as needed.  . potassium chloride SA (K-DUR,KLOR-CON) 10 MEQ tablet Take 1 tablet (10 mEq total) by mouth daily. (Patient taking differently: Take 20 mEq by mouth every morning. And takes an additional 10 meq in the evening. (30 mEq Daily))  . triamterene-hydrochlorothiazide (MAXZIDE-25) 37.5-25 MG tablet Take 1 tablet by mouth daily.    Allergies: Patient has No Known Allergies. Family History: Patient family history includes CAD in her mother; COPD in her brother and mother; Cancer in her  mother; Heart attack in her brother; Heart disease in her brother; Hyperlipidemia in her brother; Hypertension in her brother; Lung cancer in her mother. Social History:  Patient  reports that  has never smoked. she has never used smokeless tobacco. She reports that she does not drink alcohol or use drugs.  Review of Systems: Constitutional: Negative for fever malaise or anorexia Cardiovascular: negative for chest pain Respiratory: negative for SOB or persistent cough Gastrointestinal: negative for abdominal pain  Objective  Vitals: BP 124/80   Pulse 61   Temp 97.7 F (36.5 C) (Oral)   Resp 15   Ht 5\' 1"  (1.549 m)   Wt 134 lb 9.6 oz (61.1 kg)   SpO2 (!) 88%   BMI 25.43 kg/m  General: no acute distress , A&Ox3 HEENT: PEERL, conjunctiva normal, Oropharynx moist,neck is supple Cardiovascular:  RRR without murmur or gallop.  Respiratory:  Good breath sounds bilaterally, CTAB with normal respiratory effort Skin:  Warm, no rashes Ext: decreased pitting edema, now 1+-2+, LESS REDNESS, still tender   Assessment  1. Diastolic CHF, chronic (HCC)   2. Age-related osteoporosis without current pathological fracture   3. Hypertension, essential, benign   4. Stage 2 chronic kidney disease   5. Other polyneuropathy      Plan   chf:  Improving on bid lasix and maxzide. bp is holding steady w/o sxs of orthostasis hypotension. Continue same, add compression stockings, recheck renal function and electrolytes and then adjust lasix back if needed. Adjust potassium as needed as well. Recheck 2-3 weeks.  prolia today.   bp is good.   Head pain: not c/w dx of fibromyalgia. ? Migraine equivalent. No records available. Will monitor sxs and further eval depending on if she has more recurrences. Hold further lyrica at this time.   Follow up: No Follow-up on file.    Commons side effects, risks, benefits, and alternatives for medications and treatment plan prescribed today were discussed, and the  patient expressed understanding of the given instructions. Patient is instructed to call or message via MyChart if he/she has any questions or concerns regarding our treatment plan. No barriers to understanding were identified. We discussed Red Flag symptoms and signs in detail. Patient expressed understanding regarding what to do in case of urgent or emergency type symptoms.   Medication list was reconciled, printed and provided to the patient in AVS. Patient instructions and summary information was reviewed with the patient as documented in the AVS. This note was prepared with assistance of Dragon voice recognition software. Occasional wrong-word or sound-a-like substitutions may have occurred due to the inherent limitations of voice recognition software  Orders Placed This Encounter  Procedures  . Basic Metabolic Panel (BMET)  Meds ordered this encounter  Medications  . denosumab (PROLIA) injection 60 mg

## 2017-08-18 NOTE — Patient Instructions (Signed)
Please return in 2-3 weeks to recheck swelling.  If you have any questions or concerns, please don't hesitate to send me a message via MyChart or call the office at [phone]. Thank you for visiting with us today! It's our pleasure caring for you.

## 2017-08-23 ENCOUNTER — Encounter: Payer: Self-pay | Admitting: Emergency Medicine

## 2017-08-29 ENCOUNTER — Other Ambulatory Visit: Payer: Self-pay | Admitting: Family Medicine

## 2017-08-29 MED ORDER — TRIAMTERENE-HCTZ 37.5-25 MG PO TABS: 1.0000 | ORAL_TABLET | Freq: Every day | ORAL | Status: DC

## 2017-08-29 MED ORDER — POTASSIUM CHLORIDE CRYS ER 10 MEQ PO TBCR: 20.0000 meq | EXTENDED_RELEASE_TABLET | ORAL | Status: DC

## 2017-09-07 ENCOUNTER — Other Ambulatory Visit: Payer: Self-pay

## 2017-09-07 ENCOUNTER — Encounter: Payer: Self-pay | Admitting: Family Medicine

## 2017-09-07 ENCOUNTER — Ambulatory Visit (INDEPENDENT_AMBULATORY_CARE_PROVIDER_SITE_OTHER): Payer: Medicare Other | Admitting: Family Medicine

## 2017-09-07 VITALS — BP 114/72 | HR 81 | Temp 97.7°F | Resp 15 | Ht 61.0 in | Wt 132.8 lb

## 2017-09-07 DIAGNOSIS — R51 Headache: Secondary | ICD-10-CM | POA: Diagnosis not present

## 2017-09-07 DIAGNOSIS — N182 Chronic kidney disease, stage 2 (mild): Secondary | ICD-10-CM | POA: Diagnosis not present

## 2017-09-07 DIAGNOSIS — I5032 Chronic diastolic (congestive) heart failure: Secondary | ICD-10-CM

## 2017-09-07 DIAGNOSIS — I10 Essential (primary) hypertension: Secondary | ICD-10-CM | POA: Diagnosis not present

## 2017-09-07 DIAGNOSIS — R519 Headache, unspecified: Secondary | ICD-10-CM | POA: Insufficient documentation

## 2017-09-07 MED ORDER — FUROSEMIDE 40 MG PO TABS: 40.0000 mg | ORAL_TABLET | Freq: Every day | ORAL | 3 refills | Status: DC

## 2017-09-07 MED ORDER — TRIAMTERENE-HCTZ 37.5-25 MG PO TABS: 1.0000 | ORAL_TABLET | Freq: Every day | ORAL | 3 refills | Status: DC

## 2017-09-07 MED ORDER — POTASSIUM CHLORIDE CRYS ER 20 MEQ PO TBCR: 20.0000 meq | EXTENDED_RELEASE_TABLET | Freq: Every day | ORAL | 3 refills | Status: DC

## 2017-09-07 MED ORDER — POTASSIUM CHLORIDE CRYS ER 10 MEQ PO TBCR: 20.0000 meq | EXTENDED_RELEASE_TABLET | Freq: Every day | ORAL | Status: DC | PRN

## 2017-09-07 NOTE — Progress Notes (Signed)
Subjective  CC:  Chief Complaint  Patient presents with  . Congestive Heart Failure    Patient states that her swelling in her legs is better, having headaches, using Lyrica for pain but wants to know  what to use for maintenance dosage    HPI: Monica Caldwell is a 84 y.o. female who presents to the office today to address the problems listed above in the chief complaint.   CHF/leg edema f/u: doing much better. Weight is down a few more pounds but is stabilizing. On lasix 40 with 20 potassium and full dose maxzide. bp log is reviewed: running 110/70-120s/70s. Denies orthostatic sxs. No palpitations or cp. Leg swelling is much better.   Headaches: left sided and sharp and fleeting. Had several while at Pre-op visit this week for cataract surgery. She is worried about the surgery. As well, anniversary of son's death was this week so she is grieving. No new sxs; pain is same as prior headaches. Has been using lyrica prn; unclear benefit. No new neuro sxs, confusion, vision changes.   CKD: stable by recent labs. We decreased lasix dosage since last visit.  Lab Results  Component Value Date   CREATININE 1.51 (H) 08/18/2017   BUN 40 (H) 08/18/2017   NA 142 08/18/2017   K 4.1 08/18/2017   CL 101 08/18/2017   CO2 36 (H) 08/18/2017    BP Readings from Last 3 Encounters:  09/07/17 114/72  08/18/17 124/80  08/04/17 124/78   Wt Readings from Last 3 Encounters:  09/07/17 132 lb 12.8 oz (60.2 kg)  08/18/17 134 lb 9.6 oz (61.1 kg)  08/04/17 146 lb 12.8 oz (66.6 kg)    Lab Results  Component Value Date   CHOL 165 06/29/2017   Lab Results  Component Value Date   HDL 49.90 06/29/2017   Lab Results  Component Value Date   LDLCALC 98 06/29/2017   Lab Results  Component Value Date   TRIG 88.0 06/29/2017   Lab Results  Component Value Date   CHOLHDL 3 06/29/2017   I reviewed the patients updated PMH, FH, and SocHx.    Patient Active Problem List   Diagnosis Date Noted  .  Moderate persistent asthma 06/29/2017    Priority: High  . Hypertension, essential, benign 12/20/2016    Priority: High  . CKD (chronic kidney disease) 12/20/2016    Priority: High  . Diastolic CHF, chronic (HCC) 09/22/2016    Priority: High  . Osteoporosis 12/21/2011    Priority: High  . Mixed hyperlipidemia 12/21/2011    Priority: High  . Nonintractable episodic headache 09/07/2017    Priority: Medium  . History of femur fracture 06/29/2017    Priority: Medium  . Peripheral neuropathy 06/29/2017    Priority: Medium  . Chronic gout of left foot 12/20/2016    Priority: Medium  . Osteoarthritis, multiple sites     Priority: Low  . RBBB 09/22/2016    Priority: Low  . Vitamin D deficiency 12/21/2011    Priority: Low    Allergies: Patient has no known allergies.  Social History: Patient  reports that she has never smoked. She has never used smokeless tobacco. She reports that she does not drink alcohol or use drugs.  Current Meds  Medication Sig  . albuterol (PROAIR HFA) 108 (90 Base) MCG/ACT inhaler INHALE 2 PUFFS BY MOUTH EVERY 4 HOURS AS NEEDED FOR RESCUE FROM WHEEZING OR SHORTNESS OF BREATH  . diltiazem (CARDIZEM CD) 120 MG 24 hr capsule Take  1 capsule (120 mg total) by mouth daily.  . fluticasone-salmeterol (ADVAIR HFA) 115-21 MCG/ACT inhaler Inhale 2 puffs into the lungs 2 (two) times daily.  . furosemide (LASIX) 40 MG tablet Take 1 tablet (40 mg total) by mouth daily.  . indomethacin (INDOCIN) 25 MG capsule Take 25 mg by mouth as needed.  . triamterene-hydrochlorothiazide (MAXZIDE-25) 37.5-25 MG tablet Take 1 tablet by mouth daily.  . [DISCONTINUED] furosemide (LASIX) 20 MG tablet Take 40 mg by mouth daily.  . [DISCONTINUED] potassium chloride (K-DUR,KLOR-CON) 10 MEQ tablet Take 2 tablets (20 mEq total) by mouth every morning. 10 meq (1 tablet) with lasix in the afternoon  . [DISCONTINUED] triamterene-hydrochlorothiazide (MAXZIDE-25) 37.5-25 MG tablet Take 1 tablet by  mouth daily.    Review of Systems: Cardiovascular: negative for chest pain, palpitations, leg swelling, orthopnea Respiratory: negative for SOB, wheezing or persistent cough Gastrointestinal: negative for abdominal pain Genitourinary: negative for dysuria or gross hematuria  Objective  Vitals: BP 114/72   Pulse 81   Temp 97.7 F (36.5 C) (Oral)   Resp 15   Ht 5\' 1"  (1.549 m)   Wt 132 lb 12.8 oz (60.2 kg)   BMI 25.09 kg/m  General: no acute distress  Psych:  Alert and oriented, normal mood and affect, bright, happy HEENT:  Normocephalic, atraumatic, supple neck , no TA ttp bilaterally Cardiovascular:  RRR without murmur. Only tr - +1 pitting BLE edema now, minimal redness Respiratory:  Good breath sounds bilaterally, CTAB with normal respiratory effort Skin:  Warm, no rashes Neurologic:   Mental status is normal Negative orthostatic hypotension today, stable pulse in 70s  Assessment  1. Diastolic CHF, chronic (HCC)   2. Hypertension, essential, benign   3. Stage 2 chronic kidney disease   4. Nonintractable episodic headache, unspecified headache type      Plan    Hypertension f/u: BP control is well controlled. Monitor for low blood pressures or lightheadedness  Headahces: suspect triggered by stressors. Unclear type: left sided and fleeting sharp pains; evaluated by neuro with brain imaging in the past; gave Lyrica to see if it would help but didn't much. Worsened by stressors: ? Tension type, Trigeminal neuralgia type or other. rec tylenol ES. Stop lyrica  Diastolic CHF: now stable. Continue meds. Recheck bmp at next visit to monitor CKD. Avoid nsaids.   Education regarding management of these chronic disease states was given. Management strategies discussed on successive visits include dietary and exercise recommendations, goals of achieving and maintaining IBW, and lifestyle modifications aiming for adequate sleep and minimizing stressors.   Follow up: 3 mnths for  recheck   Commons side effects, risks, benefits, and alternatives for medications and treatment plan prescribed today were discussed, and the patient expressed understanding of the given instructions. Patient is instructed to call or message via MyChart if he/she has any questions or concerns regarding our treatment plan. No barriers to understanding were identified. We discussed Red Flag symptoms and signs in detail. Patient expressed understanding regarding what to do in case of urgent or emergency type symptoms.   Medication list was reconciled, printed and provided to the patient in AVS. Patient instructions and summary information was reviewed with the patient as documented in the AVS. This note was prepared with assistance of Dragon voice recognition software. Occasional wrong-word or sound-a-like substitutions may have occurred due to the inherent limitations of voice recognition software  No orders of the defined types were placed in this encounter.  Meds ordered this encounter  Medications  .  DISCONTD: potassium chloride (K-DUR,KLOR-CON) 10 MEQ tablet    Sig: Take 2 tablets (20 mEq total) by mouth daily as needed.  . furosemide (LASIX) 40 MG tablet    Sig: Take 1 tablet (40 mg total) by mouth daily.    Dispense:  90 tablet    Refill:  3  . potassium chloride (K-DUR,KLOR-CON) 20 MEQ tablet    Sig: Take 1 tablet (20 mEq total) by mouth daily. Take with lasix    Dispense:  90 tablet    Refill:  3  . triamterene-hydrochlorothiazide (MAXZIDE-25) 37.5-25 MG tablet    Sig: Take 1 tablet by mouth daily.    Dispense:  90 tablet    Refill:  3

## 2017-09-07 NOTE — Patient Instructions (Addendum)
Please return in 3 months for recheck.  AWV due in July.  Do not take lyrica. Try Tylenol ES as needed for your headaches. I think stress is playing a role in the cause.     Medicare recommends an Annual Wellness Visit for all patients. Please schedule this to be done with our Nurse Educator, Maudie Mercury. This is an informative "talk" visit; it's goals are to ensure that your health care needs are being met and to give you education regarding avoiding falls, ensuring you are not suffering from depression or problems with memory or thinking, and to educate you on Advance Care Planning. It helps me take good care of you!   If you have any questions or concerns, please don't hesitate to send me a message via MyChart or call the office at [phone]. Thank you for visiting with [place] today! It's our pleasure caring for you.

## 2017-09-08 ENCOUNTER — Encounter: Payer: Self-pay | Admitting: Family Medicine

## 2017-12-27 ENCOUNTER — Other Ambulatory Visit: Payer: Self-pay | Admitting: Physician Assistant

## 2017-12-27 ENCOUNTER — Encounter: Payer: Self-pay | Admitting: Family Medicine

## 2017-12-27 ENCOUNTER — Other Ambulatory Visit: Payer: Self-pay

## 2017-12-27 ENCOUNTER — Ambulatory Visit (INDEPENDENT_AMBULATORY_CARE_PROVIDER_SITE_OTHER): Payer: Medicare Other | Admitting: Physician Assistant

## 2017-12-27 ENCOUNTER — Encounter: Payer: Self-pay | Admitting: Physician Assistant

## 2017-12-27 VITALS — BP 100/60 | HR 68 | Temp 97.6°F | Resp 16 | Ht 61.0 in

## 2017-12-27 DIAGNOSIS — M109 Gout, unspecified: Secondary | ICD-10-CM

## 2017-12-27 LAB — BASIC METABOLIC PANEL
BUN: 59 mg/dL — ABNORMAL HIGH (ref 6–23)
CALCIUM: 8.4 mg/dL (ref 8.4–10.5)
CO2: 30 meq/L (ref 19–32)
CREATININE: 2.16 mg/dL — AB (ref 0.40–1.20)
Chloride: 100 mEq/L (ref 96–112)
GFR: 22.91 mL/min — AB (ref >60.00)
Glucose, Bld: 112 mg/dL — ABNORMAL HIGH (ref 70–99)
Potassium: 4.3 mEq/L (ref 3.5–5.1)
SODIUM: 140 meq/L (ref 135–145)

## 2017-12-27 MED ORDER — PREDNISONE 10 MG PO TABS: ORAL_TABLET | ORAL | 0 refills | Status: DC

## 2017-12-27 NOTE — Patient Instructions (Addendum)
Giving limited options due to chronic health issues, options for treatment are limited. Because steroid taper would be needed to adequately treat (short courses can cause rebound flares) I am hesitant to use due to history of heart failure.  You do tolerate the incodin well when you have taken it. Ok to restart your Indocin at 25 mg twice daily over the next 72 hours. Follow-up with Dr. Mardelle MatteAndy on Friday for reassessment of symptoms and to discuss options for ongoing gout prevention.

## 2017-12-27 NOTE — Telephone Encounter (Signed)
See my Chart message. Appointment made with CM at 1:30pm   Kathi SimpersAmy Arlenne Kimbley,  LPN

## 2017-12-27 NOTE — Progress Notes (Signed)
Patient presents to clinic today c/o with daughter c/o 1 day of redness, swelling and tenderness, consistent with her history of gout attacks. Daughter state these are usually of her feet but have occurred in upper extremities before. Is not on preventive therapy. Daughter notes she was recently put on Indocin for last attack which resolved symptoms. Notes she has taken this medicine many times in the past with good resolution, but worries about her mother having so many frequent attacks. Is keeping a clean diet. Denies fever, chills. Denies trauma/injury, numbness or tingling.   Past Medical History:  Diagnosis Date  . Arthritis   . Asthma   . Closed fracture of right distal radius and ulna 01/11/2013  . History of femur fracture 06/29/2017   Atypical with intramedullary fixation 08/2016; stopped fosamax.  . Hypertension   . Osteoarthritis, multiple sites    hips, knees, hands  . Osteoporosis   . Wears dentures    top    Current Outpatient Medications on File Prior to Visit  Medication Sig Dispense Refill  . albuterol (PROAIR HFA) 108 (90 Base) MCG/ACT inhaler INHALE 2 PUFFS BY MOUTH EVERY 4 HOURS AS NEEDED FOR RESCUE FROM WHEEZING OR SHORTNESS OF BREATH    . diltiazem (CARDIZEM CD) 120 MG 24 hr capsule Take 1 capsule (120 mg total) by mouth daily. 30 capsule 5  . fluticasone-salmeterol (ADVAIR HFA) 115-21 MCG/ACT inhaler Inhale 2 puffs into the lungs 2 (two) times daily. 1 Inhaler   . furosemide (LASIX) 40 MG tablet Take 1 tablet (40 mg total) by mouth daily. 90 tablet 3  . moxifloxacin (VIGAMOX) 0.5 % ophthalmic solution   0  . potassium chloride (K-DUR,KLOR-CON) 20 MEQ tablet Take 1 tablet (20 mEq total) by mouth daily. Take with lasix 90 tablet 3  . triamterene-hydrochlorothiazide (MAXZIDE-25) 37.5-25 MG tablet Take 1 tablet by mouth daily. 90 tablet 3  . indomethacin (INDOCIN) 25 MG capsule Take 25 mg by mouth as needed.     No current facility-administered medications on file prior  to visit.     No Known Allergies  Family History  Problem Relation Age of Onset  . CAD Mother   . Lung cancer Mother   . Cancer Mother   . COPD Mother   . Hypertension Brother   . COPD Brother   . Heart disease Brother   . Hyperlipidemia Brother   . Heart attack Brother   . Diabetes Neg Hx     Social History   Socioeconomic History  . Marital status: Widowed    Spouse name: Not on file  . Number of children: Not on file  . Years of education: Not on file  . Highest education level: Not on file  Occupational History  . Not on file  Social Needs  . Financial resource strain: Not on file  . Food insecurity:    Worry: Not on file    Inability: Not on file  . Transportation needs:    Medical: Not on file    Non-medical: Not on file  Tobacco Use  . Smoking status: Never Smoker  . Smokeless tobacco: Never Used  Substance and Sexual Activity  . Alcohol use: No  . Drug use: No  . Sexual activity: Never  Lifestyle  . Physical activity:    Days per week: Not on file    Minutes per session: Not on file  . Stress: Not on file  Relationships  . Social connections:    Talks on phone: Not  on file    Gets together: Not on file    Attends religious service: Not on file    Active member of club or organization: Not on file    Attends meetings of clubs or organizations: Not on file    Relationship status: Not on file  Other Topics Concern  . Not on file  Social History Narrative  . Not on file   Review of Systems - See HPI.  All other ROS are negative.  BP 100/60   Temp 97.6 F (36.4 C) (Oral)   Resp 16   Ht 5\' 1"  (1.549 m)   BMI 25.09 kg/m   Physical Exam  Constitutional: She appears well-developed and well-nourished.  HENT:  Head: Normocephalic and atraumatic.  Cardiovascular: Normal rate, regular rhythm and normal heart sounds.  Musculoskeletal:       Right wrist: She exhibits tenderness and swelling. She exhibits normal range of motion and no bony  tenderness.  Psychiatric: She has a normal mood and affect.  Vitals reviewed.  Assessment/Plan: 1. Acute gout of right wrist, unspecified cause Wrist red, swollen and warm. ROM intact but reproduces pain. Tophi noted of hands bilaterally. Discussed options for treatment and reservations about use of each medicine due to advanced age, CKD and CHF. Patient has been on Indocin 25 mg previously with good relief. Tolerates very well per daughter. Will start short course of low dose indocin BID with close follow-up. Check BMP today. Will need very close follow-up with PCP to discuss potential addition of urate-lowering therapies. Needs uric acid level once flare is resolved as this is typically falsely normal during acute episodes. Follow-up Friday (2 days). Strict ER precautions reviewed.  - Basic metabolic panel   Piedad Climes, PA-C

## 2018-01-02 ENCOUNTER — Other Ambulatory Visit: Payer: Self-pay | Admitting: Family Medicine

## 2018-01-03 ENCOUNTER — Other Ambulatory Visit: Payer: Self-pay

## 2018-01-03 ENCOUNTER — Ambulatory Visit (INDEPENDENT_AMBULATORY_CARE_PROVIDER_SITE_OTHER): Payer: Medicare Other | Admitting: Family Medicine

## 2018-01-03 VITALS — BP 124/76 | Temp 97.6°F | Ht 61.0 in | Wt 121.4 lb

## 2018-01-03 DIAGNOSIS — N179 Acute kidney failure, unspecified: Secondary | ICD-10-CM

## 2018-01-03 DIAGNOSIS — M109 Gout, unspecified: Secondary | ICD-10-CM | POA: Diagnosis not present

## 2018-01-03 DIAGNOSIS — N189 Chronic kidney disease, unspecified: Secondary | ICD-10-CM | POA: Diagnosis not present

## 2018-01-03 DIAGNOSIS — N182 Chronic kidney disease, stage 2 (mild): Secondary | ICD-10-CM | POA: Diagnosis not present

## 2018-01-03 LAB — COMPREHENSIVE METABOLIC PANEL
ALT: 13 U/L (ref 0–35)
AST: 10 U/L (ref 0–37)
Albumin: 3.9 g/dL (ref 3.5–5.2)
Alkaline Phosphatase: 58 U/L (ref 39–117)
BUN: 55 mg/dL — ABNORMAL HIGH (ref 6–23)
CALCIUM: 9.6 mg/dL (ref 8.4–10.5)
CHLORIDE: 98 meq/L (ref 96–112)
CO2: 35 mEq/L — ABNORMAL HIGH (ref 19–32)
Creatinine, Ser: 1.9 mg/dL — ABNORMAL HIGH (ref 0.40–1.20)
GFR: 26.56 mL/min — ABNORMAL LOW (ref >60.00)
Glucose, Bld: 93 mg/dL (ref 70–99)
POTASSIUM: 4.2 meq/L (ref 3.5–5.1)
Sodium: 142 mEq/L (ref 135–145)
Total Bilirubin: 0.6 mg/dL (ref 0.2–1.2)
Total Protein: 6.9 g/dL (ref 6.0–8.3)

## 2018-01-03 MED ORDER — ALBUTEROL SULFATE HFA 108 (90 BASE) MCG/ACT IN AERS: INHALATION_SPRAY | RESPIRATORY_TRACT | 5 refills | Status: DC

## 2018-01-03 MED ORDER — FLUTICASONE-SALMETEROL 115-21 MCG/ACT IN AERO: 2.0000 | INHALATION_SPRAY | Freq: Two times a day (BID) | RESPIRATORY_TRACT | 3 refills | Status: DC

## 2018-01-03 MED ORDER — PREDNISONE 10 MG PO TABS
ORAL_TABLET | ORAL | 0 refills | Status: DC
Start: 2018-01-03 — End: 2018-01-23

## 2018-01-03 NOTE — Progress Notes (Signed)
Weight: 121 lb 6.4 oz (55.1 kg)  Wt Readings from Last 3 Encounters:  01/03/18 121 lb 6.4 oz (55.1 kg)  09/07/17 132 lb 12.8 oz (60.2 kg)  08/18/17 134 lb 9.6 oz (61.1 kg)     Subjective  CC:  Chief Complaint  Patient presents with  . Follow-up    Treated for Gout on 12/27/17, finished medication.  Soreness in ankles     HPI: Monica Caldwell is a 84 y.o. female who presents to the office today to address the problems listed above in the chief complaint.  I've reviewed last note. Doing much better. Was given pred for 3 days due to elevated creat and wanting to avoid nsaids. Definitely helped. Swelling and redness are down. Pain is better but rebounding a little. No sob or cp. No fever.   Had been on lasix 40 daily with hctz. Edema is stable.   Assessment  1. Acute gout of right wrist, unspecified cause   2. Stage 2 chronic kidney disease   3. Acute renal failure superimposed on chronic kidney disease, unspecified CKD stage, unspecified acute renal failure type (HCC)      Plan   gout:  Improving. Prednisone for 7 days. Elevate legs. Repeat blood work.   Monitor creat and weight. Adjust diuretics as needed.   Follow up: No follow-ups on file.   Orders Placed This Encounter  Procedures  . Comprehensive metabolic panel   Meds ordered this encounter  Medications  . fluticasone-salmeterol (ADVAIR HFA) 115-21 MCG/ACT inhaler    Sig: Inhale 2 puffs into the lungs 2 (two) times daily.    Dispense:  1 Inhaler    Refill:  3  . albuterol (PROAIR HFA) 108 (90 Base) MCG/ACT inhaler    Sig: INHALE 2 PUFFS BY MOUTH EVERY 4 HOURS AS NEEDED FOR RESCUE FROM WHEEZING OR SHORTNESS OF BREATH    Dispense:  1 Inhaler    Refill:  5  . predniSONE (DELTASONE) 10 MG tablet    Sig: Take 4 tabs qd x 2 days, 3 qd x 2 days, 2 qd x 2d, 1qd x 3 days    Dispense:  21 tablet    Refill:  0      I reviewed the patients updated PMH, FH, and SocHx.    Patient Active Problem List   Diagnosis Date  Noted  . Moderate persistent asthma 06/29/2017    Priority: High  . Hypertension, essential, benign 12/20/2016    Priority: High  . CKD (chronic kidney disease) 12/20/2016    Priority: High  . Diastolic CHF, chronic (HCC) 09/22/2016    Priority: High  . Osteoporosis 12/21/2011    Priority: High  . Mixed hyperlipidemia 12/21/2011    Priority: High  . Nonintractable episodic headache 09/07/2017    Priority: Medium  . History of femur fracture 06/29/2017    Priority: Medium  . Peripheral neuropathy 06/29/2017    Priority: Medium  . Chronic gout of left foot 12/20/2016    Priority: Medium  . Osteoarthritis, multiple sites     Priority: Low  . RBBB 09/22/2016    Priority: Low  . Vitamin D deficiency 12/21/2011    Priority: Low   Current Meds  Medication Sig  . albuterol (PROAIR HFA) 108 (90 Base) MCG/ACT inhaler INHALE 2 PUFFS BY MOUTH EVERY 4 HOURS AS NEEDED FOR RESCUE FROM WHEEZING OR SHORTNESS OF BREATH  . diltiazem (CARDIZEM CD) 120 MG 24 hr capsule TAKE 1 CAPSULE BY MOUTH EVERY DAY  .  fluticasone-salmeterol (ADVAIR HFA) 115-21 MCG/ACT inhaler Inhale 2 puffs into the lungs 2 (two) times daily.  . furosemide (LASIX) 40 MG tablet Take 1 tablet (40 mg total) by mouth daily.  . potassium chloride (K-DUR,KLOR-CON) 20 MEQ tablet Take 1 tablet (20 mEq total) by mouth daily. Take with lasix  . triamterene-hydrochlorothiazide (MAXZIDE-25) 37.5-25 MG tablet Take 1 tablet by mouth daily.  . [DISCONTINUED] albuterol (PROAIR HFA) 108 (90 Base) MCG/ACT inhaler INHALE 2 PUFFS BY MOUTH EVERY 4 HOURS AS NEEDED FOR RESCUE FROM WHEEZING OR SHORTNESS OF BREATH  . [DISCONTINUED] fluticasone-salmeterol (ADVAIR HFA) 115-21 MCG/ACT inhaler Inhale 2 puffs into the lungs 2 (two) times daily.    Allergies: Patient has No Known Allergies. Family History: Patient family history includes CAD in her mother; COPD in her brother and mother; Cancer in her mother; Heart attack in her brother; Heart disease in  her brother; Hyperlipidemia in her brother; Hypertension in her brother; Lung cancer in her mother. Social History:  Patient  reports that she has never smoked. She has never used smokeless tobacco. She reports that she does not drink alcohol or use drugs.  Review of Systems: Constitutional: Negative for fever malaise or anorexia Cardiovascular: negative for chest pain Respiratory: negative for SOB or persistent cough Gastrointestinal: negative for abdominal pain  Objective  Vitals: BP 124/76   Temp 97.6 F (36.4 C)   Ht 5\' 1"  (1.549 m)   Wt 121 lb 6.4 oz (55.1 kg)   BMI 22.94 kg/m  General: no acute distress , A&Ox3 HEENT: PEERL, conjunctiva normal, Oropharynx moist,neck is supple Cardiovascular:  RRR Respiratory:  Good breath sounds bilaterally, CTAB with normal respiratory effort Skin:  Warm, chronic changes present b LE + 1+ edema Pain dorsal right foot with some swelling     Commons side effects, risks, benefits, and alternatives for medications and treatment plan prescribed today were discussed, and the patient expressed understanding of the given instructions. Patient is instructed to call or message via MyChart if he/she has any questions or concerns regarding our treatment plan. No barriers to understanding were identified. We discussed Red Flag symptoms and signs in detail. Patient expressed understanding regarding what to do in case of urgent or emergency type symptoms.   Medication list was reconciled, printed and provided to the patient in AVS. Patient instructions and summary information was reviewed with the patient as documented in the AVS. This note was prepared with assistance of Dragon voice recognition software. Occasional wrong-word or sound-a-like substitutions may have occurred due to the inherent limitations of voice recognition software

## 2018-01-03 NOTE — Patient Instructions (Signed)
Please follow up if symptoms do not improve or as needed.   I will let you know about the lab test results. We will need to lower the dose of the lasix if the kidney function remains worse and if weight keeps going down.   Please check your weight every day.

## 2018-01-04 NOTE — Progress Notes (Signed)
Please call patient: I have reviewed his/her lab results. Renal function looks a little better. Complete prednisone and once gout flare resolved, she may try cutting back on her lasix dose. Currently taking 40mg  daily, can cut back slowly: try decreasing to 20mg  MWF and 40mg  all other days and monitor weight and leg swelling.

## 2018-01-08 ENCOUNTER — Encounter: Payer: Self-pay | Admitting: Family Medicine

## 2018-01-23 ENCOUNTER — Encounter: Payer: Self-pay | Admitting: Family Medicine

## 2018-01-23 ENCOUNTER — Telehealth: Payer: Self-pay | Admitting: Emergency Medicine

## 2018-01-23 ENCOUNTER — Other Ambulatory Visit: Payer: Self-pay | Admitting: Family Medicine

## 2018-01-23 MED ORDER — PREDNISONE 10 MG PO TABS: ORAL_TABLET | ORAL | 0 refills | Status: DC

## 2018-01-23 NOTE — Telephone Encounter (Signed)
Received and reviewed medication refill request.  Request is appropriate and was approved.  Please see medication orders for details.   Haileigh Pitz,  LPN   

## 2018-01-23 NOTE — Telephone Encounter (Signed)
Rx Refilled to Pharmacy, Patient's daughter informed.

## 2018-01-23 NOTE — Telephone Encounter (Signed)
Copied from CRM [id number]. Topic: Quick Communication - Rx Refill/Question >> Jan 23, 2018 11:20 AM Marylen PontoMcneil, Ja-Kwan wrote: Medication: predniSONE (DELTASONE) 10 MG tablet  Has the patient contacted their pharmacy? no  Preferred Pharmacy (with phone number or street name): [id number] - [place], [place] - [address] [facility] [address] [phone] (Phone) [phone] (Fax)  Agent: Please be advised that RX refills may take up to 3 business days. We ask that you follow-up with your pharmacy.

## 2018-01-23 NOTE — Telephone Encounter (Signed)
Spoke with Patient's daughter Monica Caldwell, she states that her mother is having another Episode of Gout in both hands and feet She is wondering could she get another Prednesione Dose Pack to treat the flare-up and she will follow-up with Dr. Mardelle MatteAndy.   Please advise.

## 2018-01-23 NOTE — Telephone Encounter (Signed)
Giving history and the fact I have evaluated her previously, ok to send in prednisone taper as follows -- prednisone 10 mg tablet -- 40 mg x 2 days, 30 mg x 2 days, 20 mg x 2 days, then 10 mg x 2 days. She needs follow-up with Dr. Mardelle MatteAndy to discuss preventative measures if af appropriate instead of treating recurrent flares.

## 2018-01-23 NOTE — Telephone Encounter (Signed)
Refill of prednisone  LOV 01/03/18 Dr. Mardelle [facility]y  [facility]  [facility]  [facility]  [facility] [address] [place]

## 2018-01-23 NOTE — Telephone Encounter (Signed)
See telephone note for same message.

## 2018-01-25 ENCOUNTER — Other Ambulatory Visit: Payer: Self-pay | Admitting: Family Medicine

## 2018-01-30 IMAGING — DX DG FEMUR 2+V PORT*R*
3 series · 3 of 3 positions shown · non-contrast
Comparison: 08/26/2016

CLINICAL DATA: Post intraoperative right femur fracture repair

EXAM:
RIGHT FEMUR PORTABLE 2 VIEW; PORTABLE PELVIS 1-2 VIEWS

[femur ap]
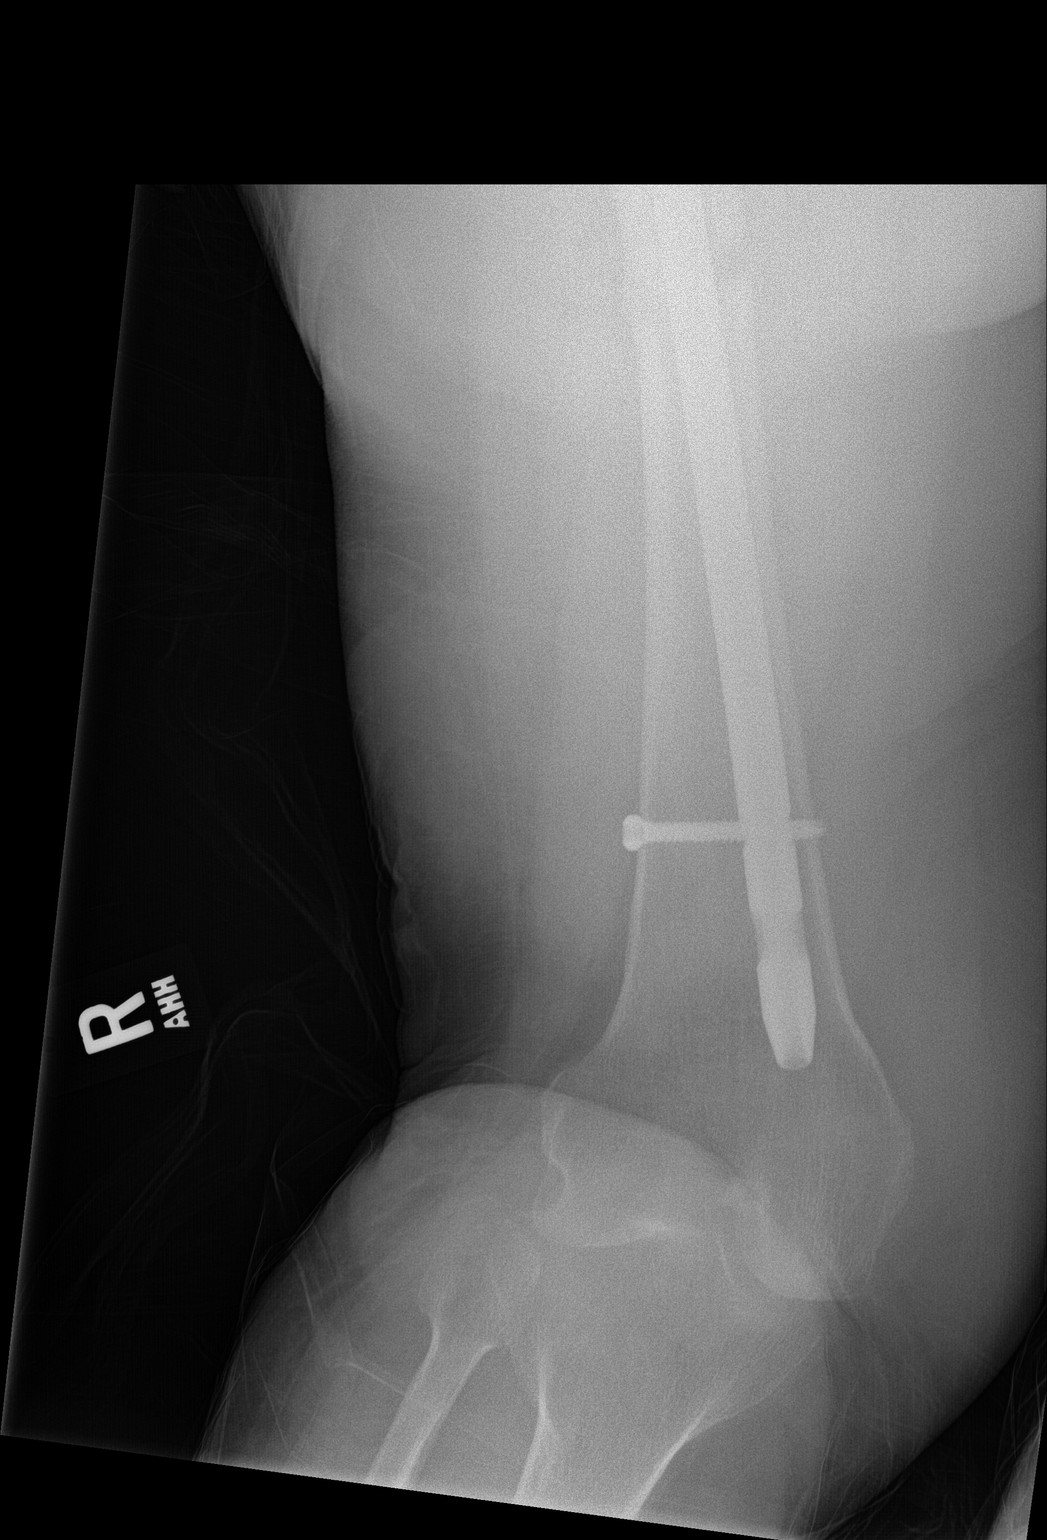

[femur lat]
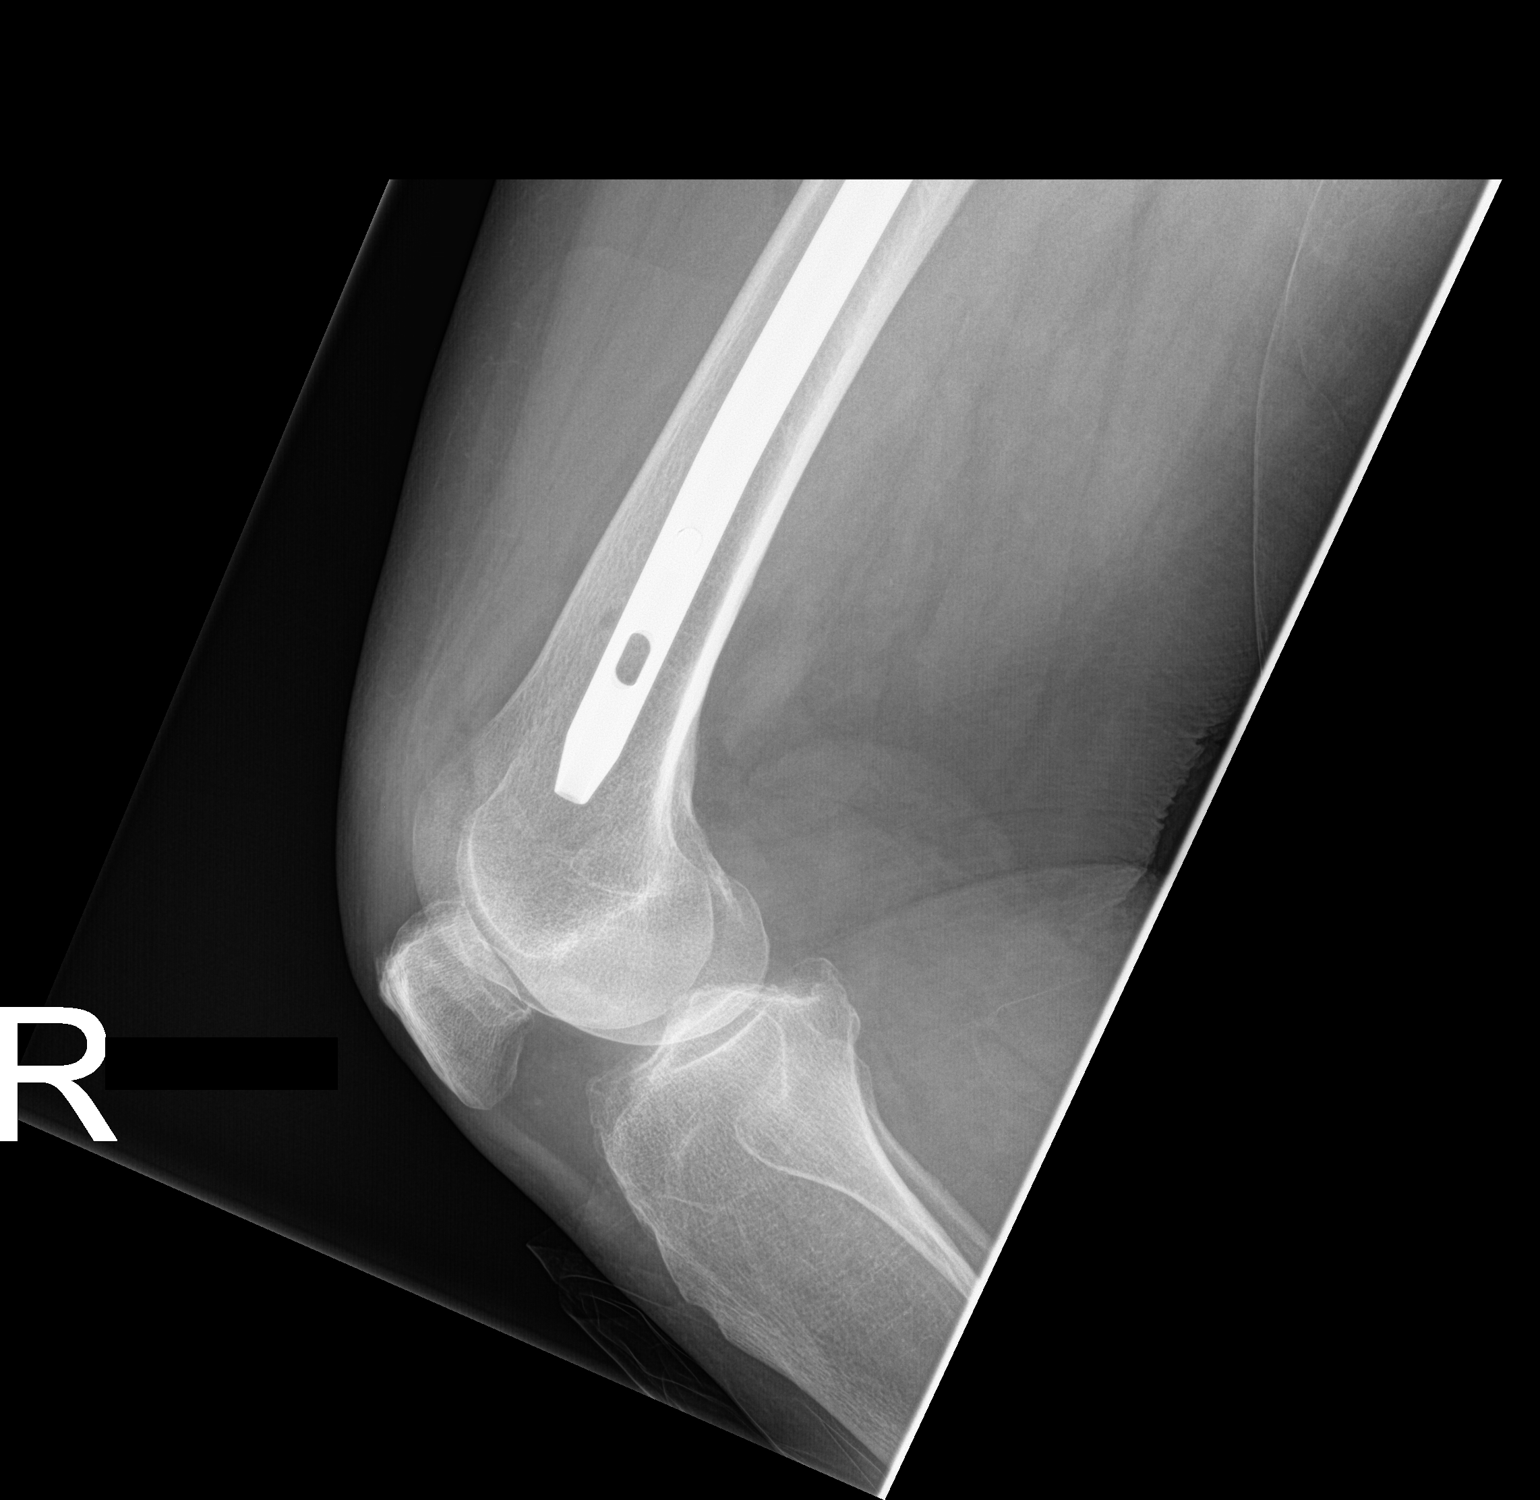

[hip lat]
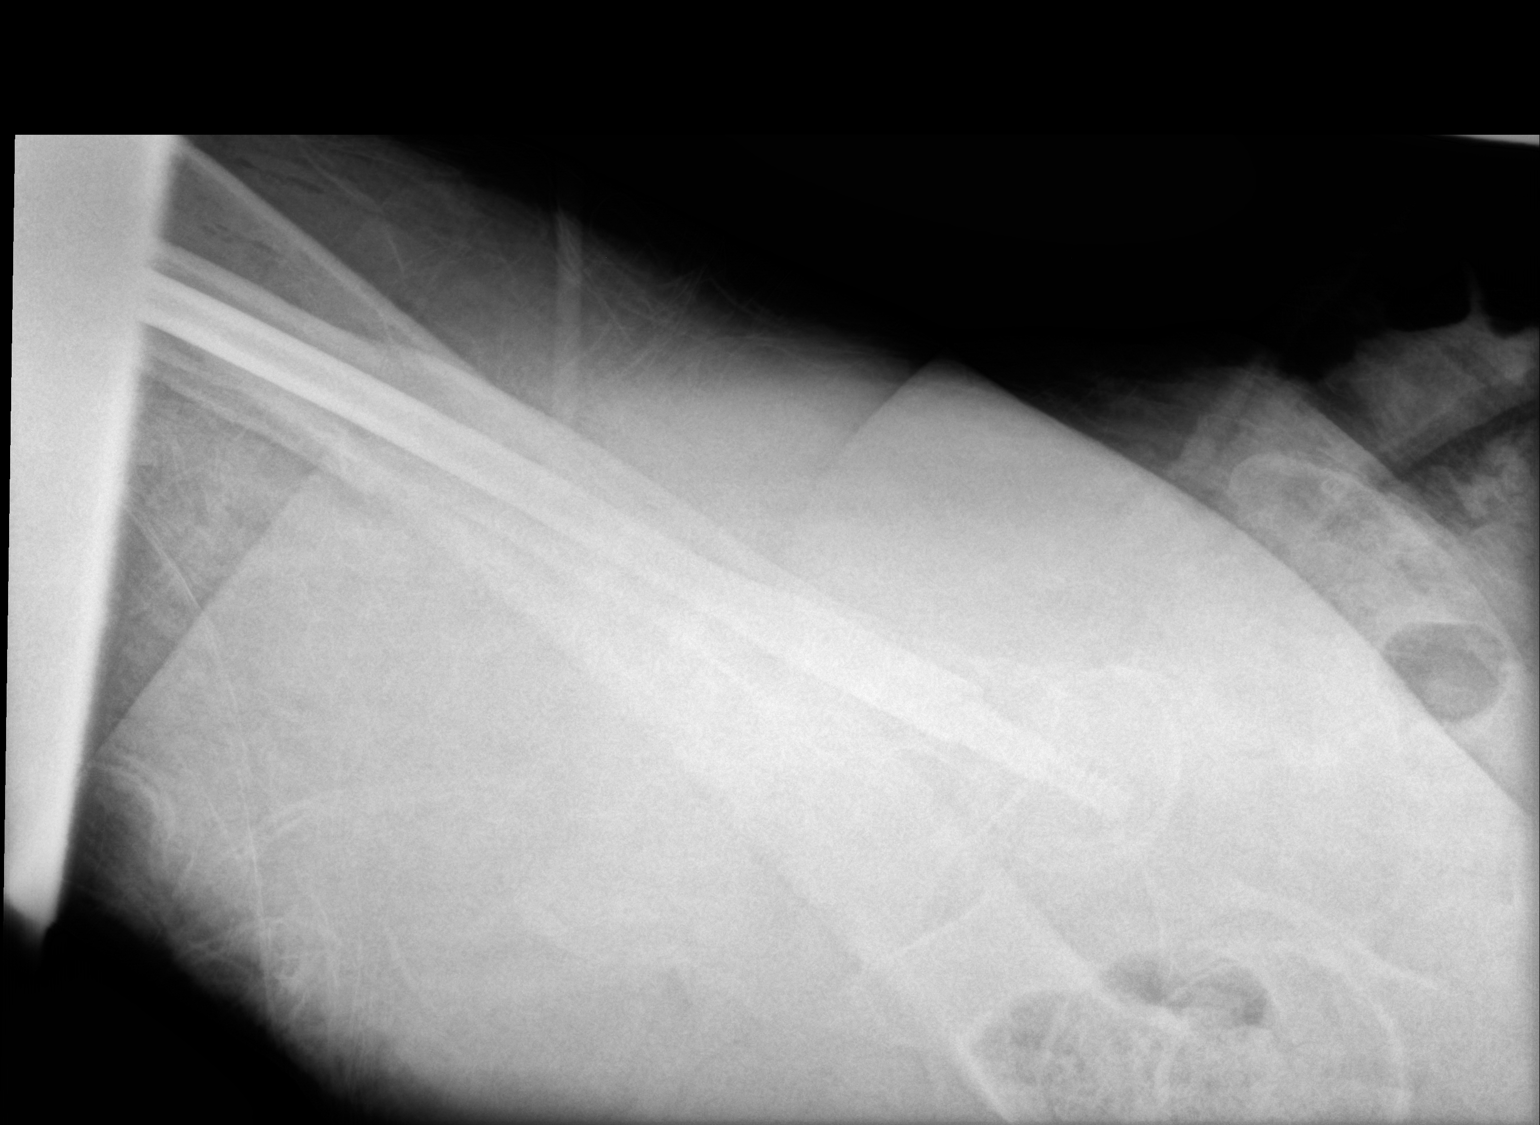

[3 of 3 positions shown; findings below may reference images not displayed]

FINDINGS: Single frontal view of the pelvis submitted. The patient is status
post intraoperative repair of proximal shaft right femoral fracture.
There is intramedullary long nail partially visualized in proximal
femur. The fracture site is not included on the film. A locking
metallic screws is noted along the right femoral neck. There is
anatomic alignment.
IMPRESSION: The patient is status post intraoperative repair of proximal shaft
right femoral fracture. There is intramedullary long nail partially
visualized in proximal femur. The fracture site is not included on
the film. A locking metallic screws is noted along the right femoral
neck. There is anatomic alignment.

Please see the operative report.

## 2018-01-30 IMAGING — DX DG PORTABLE PELVIS
1 series · 1 of 1 positions shown · non-contrast
Comparison: 08/26/2016

CLINICAL DATA: Post intraoperative right femur fracture repair

EXAM:
RIGHT FEMUR PORTABLE 2 VIEW; PORTABLE PELVIS 1-2 VIEWS

[pelvis ap]
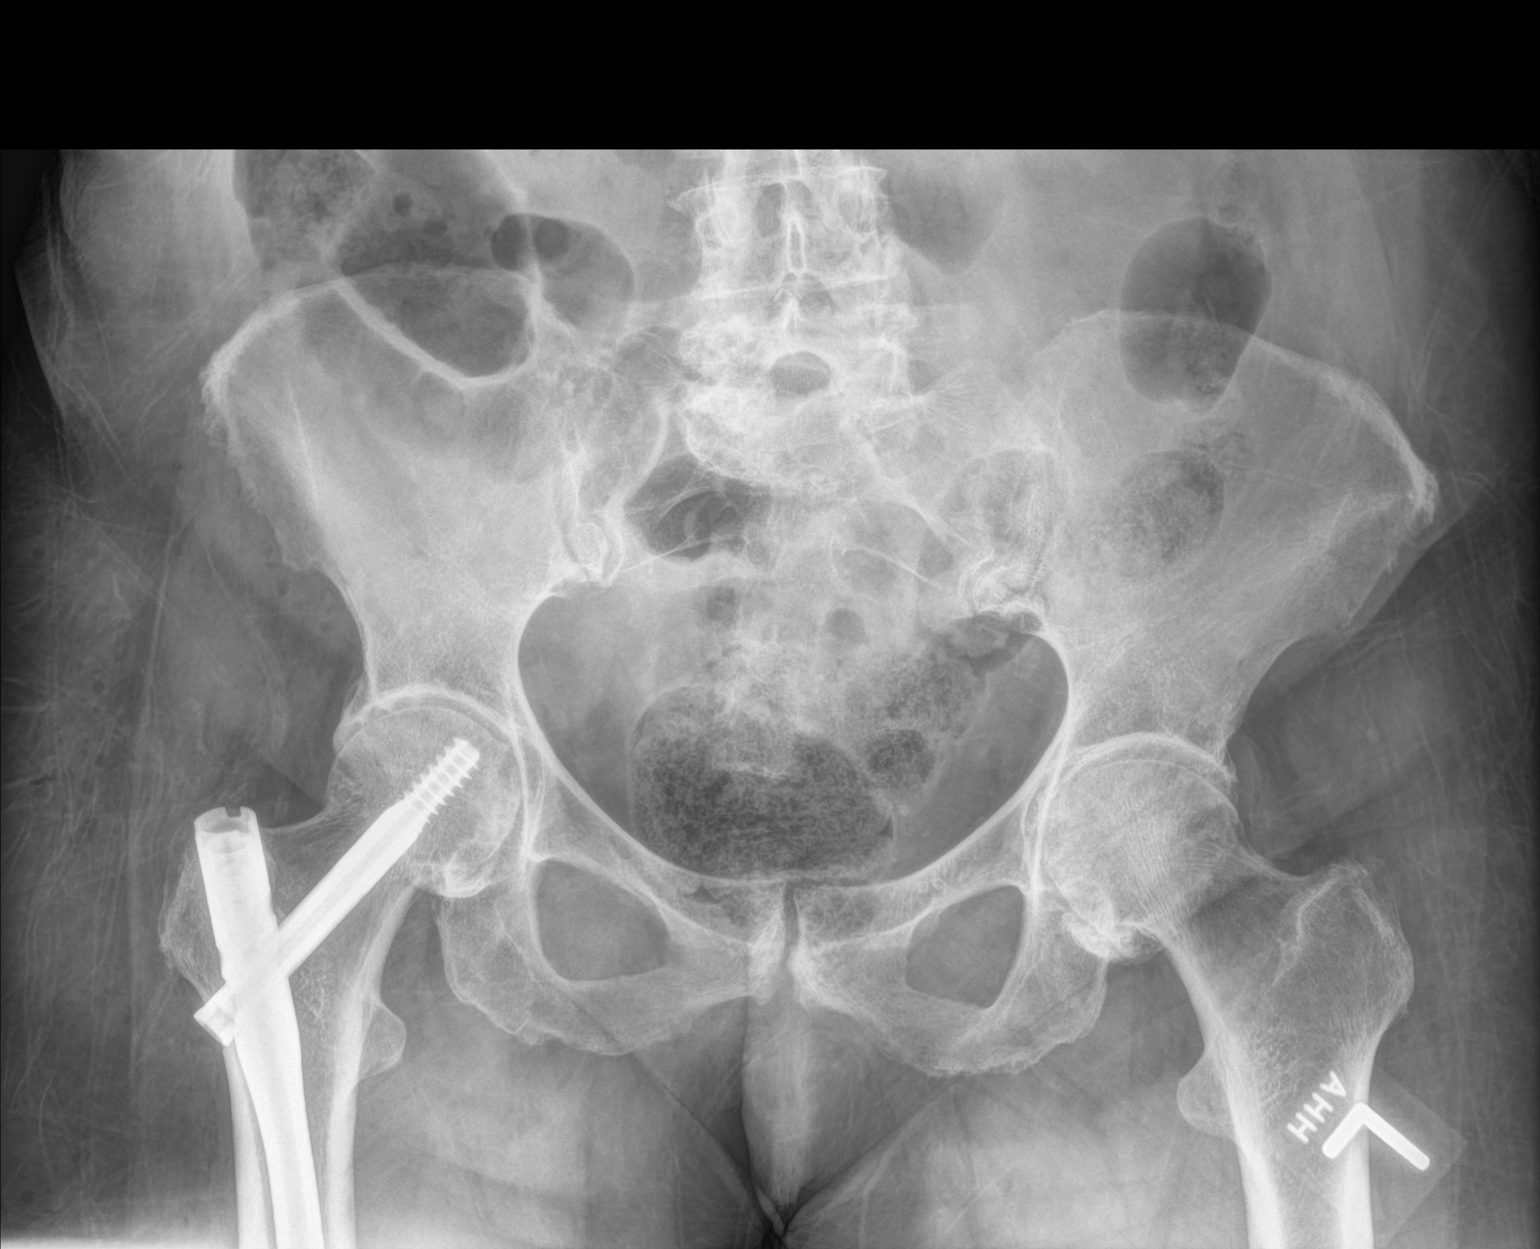

[1 of 1 positions shown; findings below may reference images not displayed]

FINDINGS: Single frontal view of the pelvis submitted. The patient is status
post intraoperative repair of proximal shaft right femoral fracture.
There is intramedullary long nail partially visualized in proximal
femur. The fracture site is not included on the film. A locking
metallic screws is noted along the right femoral neck. There is
anatomic alignment.
IMPRESSION: The patient is status post intraoperative repair of proximal shaft
right femoral fracture. There is intramedullary long nail partially
visualized in proximal femur. The fracture site is not included on
the film. A locking metallic screws is noted along the right femoral
neck. There is anatomic alignment.

Please see the operative report.

## 2018-02-07 ENCOUNTER — Encounter: Payer: Self-pay | Admitting: Family Medicine

## 2018-02-07 ENCOUNTER — Other Ambulatory Visit: Payer: Self-pay | Admitting: Physician Assistant

## 2018-02-07 NOTE — Telephone Encounter (Signed)
Spoke with Monica Caldwell, Pt's daughter, she states that Monica Caldwell is having Right Hand swelling and she states it is painful. Monica Caldwell reports that she does okay, while she is on the steroids but when she finishes them it comes right back. I made an appt for 02/07/2018 at 11:00 for evaluation.   Kathi Simpers,  LPN

## 2018-02-07 NOTE — Telephone Encounter (Signed)
Agree, needs visit.  Need to consider stopping hctz again and starting allopurinol cautiously.

## 2018-02-08 ENCOUNTER — Encounter: Payer: Self-pay | Admitting: Family Medicine

## 2018-02-08 ENCOUNTER — Other Ambulatory Visit: Payer: Self-pay

## 2018-02-08 ENCOUNTER — Ambulatory Visit (INDEPENDENT_AMBULATORY_CARE_PROVIDER_SITE_OTHER): Payer: Medicare Other | Admitting: Family Medicine

## 2018-02-08 ENCOUNTER — Telehealth: Payer: Self-pay | Admitting: Emergency Medicine

## 2018-02-08 VITALS — BP 118/82 | HR 91 | Temp 99.0°F | Ht 61.0 in | Wt 122.2 lb

## 2018-02-08 DIAGNOSIS — I10 Essential (primary) hypertension: Secondary | ICD-10-CM | POA: Diagnosis not present

## 2018-02-08 DIAGNOSIS — Z23 Encounter for immunization: Secondary | ICD-10-CM

## 2018-02-08 DIAGNOSIS — M1A09X0 Idiopathic chronic gout, multiple sites, without tophus (tophi): Secondary | ICD-10-CM

## 2018-02-08 DIAGNOSIS — I5032 Chronic diastolic (congestive) heart failure: Secondary | ICD-10-CM

## 2018-02-08 DIAGNOSIS — M810 Age-related osteoporosis without current pathological fracture: Secondary | ICD-10-CM

## 2018-02-08 LAB — BASIC METABOLIC PANEL
BUN: 41 mg/dL — ABNORMAL HIGH (ref 6–23)
CALCIUM: 9.4 mg/dL (ref 8.4–10.5)
CO2: 34 meq/L — AB (ref 19–32)
Chloride: 97 mEq/L (ref 96–112)
Creatinine, Ser: 1.52 mg/dL — ABNORMAL HIGH (ref 0.40–1.20)
GFR: 34.36 mL/min — AB (ref >60.00)
Glucose, Bld: 108 mg/dL — ABNORMAL HIGH (ref 70–99)
POTASSIUM: 3.2 meq/L — AB (ref 3.5–5.1)
SODIUM: 141 meq/L (ref 135–145)

## 2018-02-08 LAB — URIC ACID: Uric Acid, Serum: 9.2 mg/dL — ABNORMAL HIGH (ref 2.4–7.0)

## 2018-02-08 MED ORDER — POTASSIUM CHLORIDE CRYS ER 20 MEQ PO TBCR: 20.0000 meq | EXTENDED_RELEASE_TABLET | Freq: Every day | ORAL | 3 refills | Status: DC

## 2018-02-08 MED ORDER — DENOSUMAB 60 MG/ML ~~LOC~~ SOSY
60.0000 mg | PREFILLED_SYRINGE | Freq: Once | SUBCUTANEOUS | Status: AC
  Administered 2018-02-08: 60 mg via SUBCUTANEOUS

## 2018-02-08 MED ORDER — FEBUXOSTAT 40 MG PO TABS: 40.0000 mg | ORAL_TABLET | Freq: Every day | ORAL | 3 refills | Status: DC

## 2018-02-08 MED ORDER — PREDNISONE 10 MG PO TABS: ORAL_TABLET | ORAL | 0 refills | Status: DC

## 2018-02-08 NOTE — Telephone Encounter (Signed)
PA Started for Febuxostat 40mg 

## 2018-02-08 NOTE — Patient Instructions (Signed)
Please follow up if symptoms do not improve or as needed.   Start the prednisone and uloric together. Uloric is a long term medication and will prevent gout attacks.  I'll recheck your uric acid levels in 12 weeks

## 2018-02-08 NOTE — Telephone Encounter (Signed)
Febuxostat Approved from 02/08/2018-06/05/2018  Kathi Simpers,  LPN

## 2018-02-08 NOTE — Progress Notes (Signed)
Wt Readings from Last 3 Encounters:  02/08/18 122 lb 3.2 oz (55.4 kg)  01/03/18 121 lb 6.4 oz (55.1 kg)  09/07/17 132 lb 12.8 oz (60.2 kg)   BP Readings from Last 3 Encounters:  02/08/18 118/82  01/03/18 124/76  12/27/17 100/60  Work   Subjective  CC:  Chief Complaint  Patient presents with  . Hand Pain    swelling in right hand x  days  . Non-stress Test    HPI: Monica Caldwell is a 84 y.o. female who presents to the office today to address the problems listed above in the chief complaint.  84 year old with recurrent gout attacks, several over the last 1 to 2 months.  Responds well to prednisone but rebounds.  I reviewed old records; patient had been on Uloric for prevention but stopped it last fall.  Most recent uric acid level was 6 while she is taking the medication, but was as high as 12.8 prior to that.  Heart failure: Volume status is very well controlled currently on Maxide and Lasix.  Patient and her daughter understand this is unconventional treatment being on both diuretics.  Also, thiazide diuretics make gout worse.  However they are reluctant to change at this time.  Blood pressure is very well controlled.  Osteoporosis: Due for Prolia this month.  Understands risks of prednisone use Assessment  1. Idiopathic chronic gout of multiple sites without tophus   2. Diastolic CHF, chronic (HCC)   3. Hypertension, essential, benign   4. Age-related osteoporosis without current pathological fracture   5. Need for influenza vaccination      Plan   Gout, recurrent: Patient is failed allopurinol in the past.  Has done well on Uloric in the past.  Will restart while overlapping with prednisone for acute flare.  Education given.  Recheck renal function and electrolytes.  Heart failure is well controlled.  Continue current diuretics.  If gout is uncontrolled, will stop thiazides.  Patient and her daughter understand and agree with care plan.  Blood pressures well  controlled.  Weight is stable.  Osteoporosis: Prolia injection given.  Tolerated well  High-dose influenza vaccination given.  Tolerated well Follow up: Return if symptoms worsen or fail to improve.   Orders Placed This Encounter  Procedures  . Flu vaccine HIGH DOSE PF  . Uric acid  . Basic Metabolic Panel (BMET)   Meds ordered this encounter  Medications  . febuxostat (ULORIC) 40 MG tablet    Sig: Take 1 tablet (40 mg total) by mouth daily.    Dispense:  90 tablet    Refill:  3  . predniSONE (DELTASONE) 10 MG tablet    Sig: Take 6 tabs qd x 3d, 4 tabs qd x 3d, 3 tabs qd x 3d, 2 tabs qd x 3d, 1 tab qd x 3d    Dispense:  48 tablet    Refill:  0  . denosumab (PROLIA) injection 60 mg      I reviewed the patients updated PMH, FH, and SocHx.    Patient Active Problem List   Diagnosis Date Noted  . Moderate persistent asthma 06/29/2017    Priority: High  . Hypertension, essential, benign 12/20/2016    Priority: High  . CKD (chronic kidney disease) 12/20/2016    Priority: High  . Diastolic CHF, chronic (HCC) 09/22/2016    Priority: High  . Osteoporosis 12/21/2011    Priority: High  . Mixed hyperlipidemia 12/21/2011    Priority: High  . Nonintractable  episodic headache 09/07/2017    Priority: Medium  . History of femur fracture 06/29/2017    Priority: Medium  . Peripheral neuropathy 06/29/2017    Priority: Medium  . Chronic gout of left foot 12/20/2016    Priority: Medium  . Osteoarthritis, multiple sites     Priority: Low  . RBBB 09/22/2016    Priority: Low  . Vitamin D deficiency 12/21/2011    Priority: Low   Current Meds  Medication Sig  . albuterol (PROAIR HFA) 108 (90 Base) MCG/ACT inhaler INHALE 2 PUFFS BY MOUTH EVERY 4 HOURS AS NEEDED FOR RESCUE FROM WHEEZING OR SHORTNESS OF BREATH  . diltiazem (CARDIZEM CD) 120 MG 24 hr capsule TAKE 1 CAPSULE BY MOUTH EVERY DAY  . fluticasone-salmeterol (ADVAIR HFA) 115-21 MCG/ACT inhaler Inhale 2 puffs into the lungs 2  (two) times daily.  . furosemide (LASIX) 40 MG tablet Take 1 tablet (40 mg total) by mouth daily.  Marland Kitchen triamterene-hydrochlorothiazide (MAXZIDE-25) 37.5-25 MG tablet Take 1 tablet by mouth daily.    Allergies: Patient has No Known Allergies. Family History: Patient family history includes CAD in her mother; COPD in her brother and mother; Cancer in her mother; Heart attack in her brother; Heart disease in her brother; Hyperlipidemia in her brother; Hypertension in her brother; Lung cancer in her mother. Social History:  Patient  reports that she has never smoked. She has never used smokeless tobacco. She reports that she does not drink alcohol or use drugs.  Review of Systems: Constitutional: Negative for fever malaise or anorexia Cardiovascular: negative for chest pain Respiratory: negative for SOB or persistent cough Gastrointestinal: negative for abdominal pain  Objective  Vitals: BP 118/82   Pulse 91   Temp 99 F (37.2 C)   Ht 5\' 1"  (1.549 m)   Wt 122 lb 3.2 oz (55.4 kg)   BMI 23.09 kg/m  General: no acute distress , A&Ox3 HEENT: PEERL, conjunctiva normal, Oropharynx moist,neck is supple Cardiovascular:  RRR without murmur or gallop.  Respiratory:  Good breath sounds bilaterally, CTAB with normal respiratory effort Skin:  Warm, no rashes Right wrist: Swollen, red, warm, tender to touch.  Right first PIP red, swollen and painful to move Bilateral lower extremities without edema, normal.  Ankles  No visits with results within 1 Day(s) from this visit.  Latest known visit with results is:  Office Visit on 01/03/2018  Component Date Value Ref Range Status  . Sodium 01/03/2018 142  135 - 145 mEq/L Final  . Potassium 01/03/2018 4.2  3.5 - 5.1 mEq/L Final  . Chloride 01/03/2018 98  96 - 112 mEq/L Final  . CO2 01/03/2018 35* 19 - 32 mEq/L Final  . Glucose, Bld 01/03/2018 93  70 - 99 mg/dL Final  . BUN 09/81/1914 55* 6 - 23 mg/dL Final  . Creatinine, Ser 01/03/2018 1.90* 0.40 -  1.20 mg/dL Final  . Total Bilirubin 01/03/2018 0.6  0.2 - 1.2 mg/dL Final  . Alkaline Phosphatase 01/03/2018 58  39 - 117 U/L Final  . AST 01/03/2018 10  0 - 37 U/L Final  . ALT 01/03/2018 13  0 - 35 U/L Final  . Total Protein 01/03/2018 6.9  6.0 - 8.3 g/dL Final  . Albumin 78/29/5621 3.9  3.5 - 5.2 g/dL Final  . Calcium 30/86/5784 9.6  8.4 - 10.5 mg/dL Final  . GFR 69/62/9528 26.56* >60.00 mL/min Final      Commons side effects, risks, benefits, and alternatives for medications and treatment plan prescribed today were  discussed, and the patient expressed understanding of the given instructions. Patient is instructed to call or message via MyChart if he/she has any questions or concerns regarding our treatment plan. No barriers to understanding were identified. We discussed Red Flag symptoms and signs in detail. Patient expressed understanding regarding what to do in case of urgent or emergency type symptoms.   Medication list was reconciled, printed and provided to the patient in AVS. Patient instructions and summary information was reviewed with the patient as documented in the AVS. This note was prepared with assistance of Dragon voice recognition software. Occasional wrong-word or sound-a-like substitutions may have occurred due to the inherent limitations of voice recognition software

## 2018-02-22 ENCOUNTER — Ambulatory Visit: Payer: Medicare Other

## 2018-02-23 ENCOUNTER — Ambulatory Visit: Payer: Medicare Other

## 2018-06-07 ENCOUNTER — Encounter: Payer: Self-pay | Admitting: Family Medicine

## 2018-06-08 ENCOUNTER — Encounter: Payer: Self-pay | Admitting: Family Medicine

## 2018-06-08 ENCOUNTER — Ambulatory Visit (INDEPENDENT_AMBULATORY_CARE_PROVIDER_SITE_OTHER): Payer: Medicare Other | Admitting: Family Medicine

## 2018-06-08 VITALS — BP 100/76 | HR 70 | Temp 97.6°F | Resp 14 | Ht 61.0 in | Wt 126.2 lb

## 2018-06-08 DIAGNOSIS — E782 Mixed hyperlipidemia: Secondary | ICD-10-CM

## 2018-06-08 DIAGNOSIS — I5032 Chronic diastolic (congestive) heart failure: Secondary | ICD-10-CM | POA: Diagnosis not present

## 2018-06-08 DIAGNOSIS — R7989 Other specified abnormal findings of blood chemistry: Secondary | ICD-10-CM

## 2018-06-08 DIAGNOSIS — M1A0720 Idiopathic chronic gout, left ankle and foot, without tophus (tophi): Secondary | ICD-10-CM

## 2018-06-08 DIAGNOSIS — I10 Essential (primary) hypertension: Secondary | ICD-10-CM

## 2018-06-08 DIAGNOSIS — G44039 Episodic paroxysmal hemicrania, not intractable: Secondary | ICD-10-CM

## 2018-06-08 LAB — LIPID PANEL
CHOL/HDL RATIO: 4
Cholesterol: 205 mg/dL — ABNORMAL HIGH (ref 0–200)
HDL: 56 mg/dL (ref >39.00)
LDL Cholesterol: 128 mg/dL — ABNORMAL HIGH (ref 0–99)
NONHDL: 149.47
Triglycerides: 109 mg/dL (ref 0.0–149.0)
VLDL: 21.8 mg/dL (ref 0.0–40.0)

## 2018-06-08 LAB — COMPREHENSIVE METABOLIC PANEL
ALK PHOS: 50 U/L (ref 39–117)
ALT: 9 U/L (ref 0–35)
AST: 17 U/L (ref 0–37)
Albumin: 4.4 g/dL (ref 3.5–5.2)
BILIRUBIN TOTAL: 0.4 mg/dL (ref 0.2–1.2)
BUN: 55 mg/dL — ABNORMAL HIGH (ref 6–23)
CALCIUM: 9.7 mg/dL (ref 8.4–10.5)
CO2: 30 meq/L (ref 19–32)
Chloride: 103 mEq/L (ref 96–112)
Creatinine, Ser: 1.83 mg/dL — ABNORMAL HIGH (ref 0.40–1.20)
GFR: 27.71 mL/min — AB (ref >60.00)
Glucose, Bld: 94 mg/dL (ref 70–99)
Potassium: 4.4 mEq/L (ref 3.5–5.1)
Sodium: 145 mEq/L (ref 135–145)
Total Protein: 6.8 g/dL (ref 6.0–8.3)

## 2018-06-08 LAB — CBC WITH DIFFERENTIAL/PLATELET
BASOS ABS: 0.1 10*3/uL (ref 0.0–0.1)
Basophils Relative: 0.8 % (ref 0.0–3.0)
Eosinophils Absolute: 0.1 10*3/uL (ref 0.0–0.7)
Eosinophils Relative: 0.6 % (ref 0.0–5.0)
HCT: 41.8 % (ref 36.0–46.0)
Hemoglobin: 14 g/dL (ref 12.0–15.0)
Lymphocytes Relative: 28.3 % (ref 12.0–46.0)
Lymphs Abs: 2.6 10*3/uL (ref 0.7–4.0)
MCHC: 33.4 g/dL (ref 30.0–36.0)
MCV: 90.5 fl (ref 78.0–100.0)
MONOS PCT: 5.6 % (ref 3.0–12.0)
Monocytes Absolute: 0.5 10*3/uL (ref 0.1–1.0)
NEUTROS ABS: 6 10*3/uL (ref 1.4–7.7)
Neutrophils Relative %: 64.7 % (ref 43.0–77.0)
Platelets: 325 10*3/uL (ref 150.0–400.0)
RBC: 4.62 Mil/uL (ref 3.87–5.11)
RDW: 14.5 % (ref 11.5–15.5)
WBC: 9.3 10*3/uL (ref 4.0–10.5)

## 2018-06-08 LAB — URIC ACID: Uric Acid, Serum: 5.1 mg/dL (ref 2.4–7.0)

## 2018-06-08 LAB — TSH: TSH: 5.83 u[IU]/mL — ABNORMAL HIGH (ref 0.35–4.50)

## 2018-06-08 MED ORDER — AMITRIPTYLINE HCL 25 MG PO TABS: 25.0000 mg | ORAL_TABLET | Freq: Every day | ORAL | 2 refills | Status: DC

## 2018-06-08 NOTE — Progress Notes (Signed)
Subjective  CC:  Chief Complaint  Patient presents with  . Headache    Mostly forehead.Leanora Ivanoff. Takes Aleve with minimal relief.. Denies dizziness and nausea.. Pain seems to be triggered by certain head movements    HPI: Monica Caldwell is a 85 y.o. female who presents to the office today to address the problems listed above in the chief complaint.  85 year old female presents with her daughter for recurrent paroxysmal hemicrania described as stabbing sharp pains whenever she lowers her head.  This started again about 2 weeks ago.  She is had a 10 to 15-year history of the same but it had been calm over the last 6 months or so.  She did describes pain is moderate and frontal but usually unilateral.  She denies visual loss, diplopia, dysarthria, chronic headache, chewing claudication, malaise, fever or weight loss.  Her daughter feels she has been under stress but no other concerns.  Mood is good.  She had been on gabapentin in the past by her prior PCP for similar symptoms.  That was not helpful.  They deny associated tics.  Denies neck pain  Follow-up chronic heart failure, hypertension and hyperlipidemia: Doing well on her current medication regimen.  She is due for lab work due to hypokalemia Lasix and Maxide.  Blood pressures been well controlled.  She has not had any chest pain, shortness of breath, dyspnea on exertion, PND and lower extremity edema is well controlled.  Gout: Now on Uloric and due for recheck.  Has not had a flare in 3 months. Assessment  1. Episodic paroxysmal hemicrania, not intractable   2. Diastolic CHF, chronic (HCC)   3. Hypertension, essential, benign   4. Idiopathic chronic gout of left foot without tophus   5. Mixed hyperlipidemia      Plan   Headaches: Discussed with daughter possibility of trigeminal neuralgia.  Start Elavil due to poor sleep but could consider Tegretol.  Also discussed possibility of neurology and/or MRI.  Patient to start Elavil and  follow-up in 2 to 3 weeks for further testing if not improved.  Red flag symptoms discussed.  Heart failure, hypertension hyperlipidemia: All well controlled clinically.  Recheck lab work today.  No changes in medications  Gout: Recheck uric acid today.  Stable.  Osteoporosis: Due for next Prolia in March  Follow up: Return in about 3 weeks (around 06/29/2018) for recheck.  Visit date not found  Orders Placed This Encounter  Procedures  . Uric acid  . Comprehensive metabolic panel  . CBC with Differential/Platelet  . Lipid panel  . TSH   Meds ordered this encounter  Medications  . amitriptyline (ELAVIL) 25 MG tablet    Sig: Take 1 tablet (25 mg total) by mouth at bedtime.    Dispense:  30 tablet    Refill:  2      I reviewed the patients updated PMH, FH, and SocHx.    Patient Active Problem List   Diagnosis Date Noted  . Moderate persistent asthma 06/29/2017    Priority: High  . Hypertension, essential, benign 12/20/2016    Priority: High  . CKD (chronic kidney disease) 12/20/2016    Priority: High  . Diastolic CHF, chronic (HCC) 09/22/2016    Priority: High  . Osteoporosis 12/21/2011    Priority: High  . Mixed hyperlipidemia 12/21/2011    Priority: High  . Nonintractable episodic headache 09/07/2017    Priority: Medium  . History of femur fracture 06/29/2017    Priority: Medium  .  Peripheral neuropathy 06/29/2017    Priority: Medium  . Chronic gout of left foot 12/20/2016    Priority: Medium  . Osteoarthritis, multiple sites     Priority: Low  . RBBB 09/22/2016    Priority: Low  . Vitamin D deficiency 12/21/2011    Priority: Low   Current Meds  Medication Sig  . albuterol (PROAIR HFA) 108 (90 Base) MCG/ACT inhaler INHALE 2 PUFFS BY MOUTH EVERY 4 HOURS AS NEEDED FOR RESCUE FROM WHEEZING OR SHORTNESS OF BREATH  . diltiazem (CARDIZEM CD) 120 MG 24 hr capsule TAKE 1 CAPSULE BY MOUTH EVERY DAY  . febuxostat (ULORIC) 40 MG tablet Take 1 tablet (40 mg total) by  mouth daily.  . fluticasone-salmeterol (ADVAIR HFA) 115-21 MCG/ACT inhaler Inhale 2 puffs into the lungs 2 (two) times daily.  . furosemide (LASIX) 40 MG tablet Take 1 tablet (40 mg total) by mouth daily.  . indomethacin (INDOCIN) 25 MG capsule Take 25 mg by mouth as needed.  . potassium chloride SA (K-DUR,KLOR-CON) 20 MEQ tablet Take 1 tablet (20 mEq total) by mouth daily.  Marland Kitchen triamterene-hydrochlorothiazide (MAXZIDE-25) 37.5-25 MG tablet Take 1 tablet by mouth daily.     Allergies: Patient has No Known Allergies. Family History: Patient family history includes CAD in her mother; COPD in her brother and mother; Cancer in her mother; Heart attack in her brother; Heart disease in her brother; Hyperlipidemia in her brother; Hypertension in her brother; Lung cancer in her mother. Social History:  Patient  reports that she has never smoked. She has never used smokeless tobacco. She reports that she does not drink alcohol or use drugs.  Review of Systems: Constitutional: Negative for fever malaise or anorexia Cardiovascular: negative for chest pain Respiratory: negative for SOB or persistent cough Gastrointestinal: negative for abdominal pain  Objective  Vitals: BP 100/76   Pulse 70   Temp 97.6 F (36.4 C) (Oral)   Resp 14   Ht 5\' 1"  (1.549 m)   Wt 126 lb 3.2 oz (57.2 kg)   SpO2 96%   BMI 23.85 kg/m  General: no acute distress , A&Ox3, did experience 1 episode of shooting pain while in the office.  Lasted moments. HEENT: PEERL, conjunctiva normal, Oropharynx moist,neck is supple, nontender temporal arteries bilaterally Cardiovascular:  RRR without murmur or gallop.,  Trace edema bilaterally Respiratory:  Good breath sounds bilaterally, CTAB with normal respiratory effort Skin:  Warm, no rashes Nonfocal neuro exam     Commons side effects, risks, benefits, and alternatives for medications and treatment plan prescribed today were discussed, and the patient expressed understanding  of the given instructions. Patient is instructed to call or message via MyChart if he/she has any questions or concerns regarding our treatment plan. No barriers to understanding were identified. We discussed Red Flag symptoms and signs in detail. Patient expressed understanding regarding what to do in case of urgent or emergency type symptoms.   Medication list was reconciled, printed and provided to the patient in AVS. Patient instructions and summary information was reviewed with the patient as documented in the AVS. This note was prepared with assistance of Dragon voice recognition software. Occasional wrong-word or sound-a-like substitutions may have occurred due to the inherent limitations of voice recognition software

## 2018-06-08 NOTE — Patient Instructions (Signed)
Needs nurse visit in March 2020 for Prolia injection. Please return in 3-4 weeks for recheck of headaches on medication.   If you have any questions or concerns, please don't hesitate to send me a message via MyChart or call the office at [phone]. Thank you for visiting with [place] today! It's our pleasure caring for you.

## 2018-06-09 NOTE — Progress Notes (Signed)
Elevated lipids not on meds.  Awaiting TFTs Consider lowering lasix due to renal function bump.  Continue uloric

## 2018-06-09 NOTE — Progress Notes (Signed)
Please add on freeT4 and T3, dx: abnl tsh Thanks, Dr. Mardelle Matte '

## 2018-06-11 ENCOUNTER — Other Ambulatory Visit: Payer: Medicare Other

## 2018-06-11 DIAGNOSIS — R7989 Other specified abnormal findings of blood chemistry: Secondary | ICD-10-CM

## 2018-06-11 NOTE — Telephone Encounter (Signed)
-----   Message from Willow Ora, MD sent at 06/09/2018 11:56 AM EST ----- Please add on freeT4 and T3, dx: abnl tsh Thanks, Dr. Mardelle Matte '

## 2018-06-11 NOTE — Telephone Encounter (Signed)
Add on has been faxed to [facility]

## 2018-06-11 NOTE — Addendum Note (Signed)
Addended by: Laddie Aquas A on: 06/11/2018 12:48 PM   Modules accepted: Orders

## 2018-06-14 ENCOUNTER — Telehealth: Payer: Self-pay

## 2018-06-14 DIAGNOSIS — R7989 Other specified abnormal findings of blood chemistry: Secondary | ICD-10-CM

## 2018-06-14 NOTE — Telephone Encounter (Signed)
-----   Message from Willow Ora, MD sent at 06/14/2018  4:32 PM EST ----- Did free t4 get ordered?

## 2018-06-14 NOTE — Telephone Encounter (Signed)
Thanks. Can you call and f/u on it if not back tomorrow.

## 2018-06-14 NOTE — Telephone Encounter (Signed)
Add on for T3 and T4 sent 06/11/18.

## 2018-06-14 NOTE — Addendum Note (Signed)
Addended by: Laddie Aquas A on: 06/14/2018 04:56 PM   Modules accepted: Orders

## 2018-06-16 LAB — TEST AUTHORIZATION

## 2018-06-16 LAB — T3: T3, Total: 85 ng/dL (ref 76–181)

## 2018-06-16 LAB — T4, FREE: Free T4: 1 ng/dL (ref 0.8–1.8)

## 2018-06-29 ENCOUNTER — Ambulatory Visit: Payer: Medicare Other | Admitting: Family Medicine

## 2018-07-02 ENCOUNTER — Encounter: Payer: Self-pay | Admitting: Family Medicine

## 2018-07-02 ENCOUNTER — Other Ambulatory Visit: Payer: Self-pay

## 2018-07-02 ENCOUNTER — Ambulatory Visit (INDEPENDENT_AMBULATORY_CARE_PROVIDER_SITE_OTHER): Payer: Medicare Other | Admitting: Family Medicine

## 2018-07-02 VITALS — BP 124/80 | HR 53 | Temp 97.5°F | Resp 14 | Ht 61.0 in | Wt 133.6 lb

## 2018-07-02 DIAGNOSIS — I5032 Chronic diastolic (congestive) heart failure: Secondary | ICD-10-CM | POA: Diagnosis not present

## 2018-07-02 DIAGNOSIS — E039 Hypothyroidism, unspecified: Secondary | ICD-10-CM | POA: Diagnosis not present

## 2018-07-02 DIAGNOSIS — E038 Other specified hypothyroidism: Secondary | ICD-10-CM

## 2018-07-02 DIAGNOSIS — N183 Chronic kidney disease, stage 3 unspecified: Secondary | ICD-10-CM

## 2018-07-02 DIAGNOSIS — G44039 Episodic paroxysmal hemicrania, not intractable: Secondary | ICD-10-CM

## 2018-07-02 DIAGNOSIS — E782 Mixed hyperlipidemia: Secondary | ICD-10-CM

## 2018-07-02 DIAGNOSIS — H9311 Tinnitus, right ear: Secondary | ICD-10-CM

## 2018-07-02 DIAGNOSIS — H6121 Impacted cerumen, right ear: Secondary | ICD-10-CM

## 2018-07-02 NOTE — Progress Notes (Signed)
Subjective  CC:  Chief Complaint  Patient presents with  . Follow-up    Discuss recent labs and possible start of medication  . Headache    They have resolved    HPI: Monica Caldwell is a 85 y.o. female who presents to the office today to address the problems listed above in the chief complaint.  Monica Caldwell is here today with her daughter to follow-up on her headaches.  At last visit we considered the diagnosis of trigeminal neuralgia.  She was started on low-dose Elavil but only took it twice due to feeling groggy in the morning.  Her daughter reports that her headaches are much improved having them only on occasion.  Using Tylenol as needed.  Symptoms are otherwise unchanged.  Lab review: New diagnosis of subclinical hypothyroidism.  Mildly elevated hyperlipidemia not on medications.  Chronic kidney disease with mild bump in creatinine.  CHF: Weight is up, she is wearing heavier clothes in the winter jacket.  She also has some lower extremity edema.  She continues on her diuretics. Lab Results  Component Value Date   CREATININE 1.83 (H) 06/08/2018   BUN 55 (H) 06/08/2018   NA 145 06/08/2018   K 4.4 06/08/2018   CL 103 06/08/2018   CO2 30 06/08/2018    New complaint: Complains of ringing in her right ear with mild decreased hearing on that side.  She reports she has very little hearing on the left.  No pain.  Wt Readings from Last 3 Encounters:  07/02/18 133 lb 9.6 oz (60.6 kg)  06/08/18 126 lb 3.2 oz (57.2 kg)  02/08/18 122 lb 3.2 oz (55.4 kg)    Assessment  1. Episodic paroxysmal hemicrania, not intractable   2. Subclinical hypothyroidism   3. Diastolic CHF, chronic (HCC)   4. Mixed hyperlipidemia   5. Right-sided tinnitus   6. Right ear impacted cerumen   7. Stage 3 chronic kidney disease (HCC)      Plan   Headaches: Episodic paroxysmal hemicrania versus trigeminal neuralgia.  Patient and daughter elected to hold the course for now.  Using Tylenol as needed.  If  worsen again, recommend trial of Tegretol.  Could also have neurology evaluate her.  Subclinical hypothyroidism: We will monitor every 3 to 6 months.  Start levothyroxine, low-dose, if trending upward.  CHF: Fairly well controlled clinically on diuretics.  Will monitor renal function.  No change in diuretic dosing today.  Monitor weight and food intake.  Next hyperlipidemia in an 85 year old.  No lipid-lowering medications per patient and her daughter.  I agree with this.  Right-sided impacted cerumen removed with irrigation.  Mild tinnitus likely related to hearing loss.  Chronic kidney disease monitoring every 3 to 6 months.  Follow up: Return in about 3 months (around 10/01/2018) for recheck.  Visit date not found  No orders of the defined types were placed in this encounter.  No orders of the defined types were placed in this encounter.     I reviewed the patients updated PMH, FH, and SocHx.    Patient Active Problem List   Diagnosis Date Noted  . Moderate persistent asthma 06/29/2017    Priority: High  . Hypertension, essential, benign 12/20/2016    Priority: High  . CKD (chronic kidney disease) 12/20/2016    Priority: High  . Diastolic CHF, chronic (HCC) 09/22/2016    Priority: High  . Osteoporosis 12/21/2011    Priority: High  . Mixed hyperlipidemia 12/21/2011    Priority: High  .  Nonintractable episodic headache 09/07/2017    Priority: Medium  . History of femur fracture 06/29/2017    Priority: Medium  . Peripheral neuropathy 06/29/2017    Priority: Medium  . Chronic gout of left foot 12/20/2016    Priority: Medium  . Osteoarthritis, multiple sites     Priority: Low  . RBBB 09/22/2016    Priority: Low  . Vitamin D deficiency 12/21/2011    Priority: Low   Current Meds  Medication Sig  . albuterol (PROAIR HFA) 108 (90 Base) MCG/ACT inhaler INHALE 2 PUFFS BY MOUTH EVERY 4 HOURS AS NEEDED FOR RESCUE FROM WHEEZING OR SHORTNESS OF BREATH  . amitriptyline  (ELAVIL) 25 MG tablet Take 1 tablet (25 mg total) by mouth at bedtime.  Marland Kitchen diltiazem (CARDIZEM CD) 120 MG 24 hr capsule TAKE 1 CAPSULE BY MOUTH EVERY DAY  . febuxostat (ULORIC) 40 MG tablet Take 1 tablet (40 mg total) by mouth daily.  . fluticasone-salmeterol (ADVAIR HFA) 115-21 MCG/ACT inhaler Inhale 2 puffs into the lungs 2 (two) times daily.  . furosemide (LASIX) 40 MG tablet Take 1 tablet (40 mg total) by mouth daily.  . indomethacin (INDOCIN) 25 MG capsule Take 25 mg by mouth as needed.  . potassium chloride SA (K-DUR,KLOR-CON) 20 MEQ tablet Take 1 tablet (20 mEq total) by mouth daily.  Marland Kitchen triamterene-hydrochlorothiazide (MAXZIDE-25) 37.5-25 MG tablet Take 1 tablet by mouth daily.    Allergies: Patient has No Known Allergies. Family History: Patient family history includes CAD in her mother; COPD in her brother and mother; Cancer in her mother; Heart attack in her brother; Heart disease in her brother; Hyperlipidemia in her brother; Hypertension in her brother; Lung cancer in her mother. Social History:  Patient  reports that she has never smoked. She has never used smokeless tobacco. She reports that she does not drink alcohol or use drugs.  Review of Systems: Constitutional: Negative for fever malaise or anorexia Cardiovascular: negative for chest pain Respiratory: negative for SOB or persistent cough Gastrointestinal: negative for abdominal pain  Objective  Vitals: BP 124/80   Pulse (!) 53   Temp (!) 97.5 F (36.4 C) (Oral)   Resp 14   Ht 5\' 1"  (1.549 m)   Wt 133 lb 9.6 oz (60.6 kg)   SpO2 91%   BMI 25.24 kg/m  General: no acute distress , A&Ox3, hard of hearing HEENT: PEERL, conjunctiva normal, left TM normal, right TM obstructed initially with cerumen, after irrigation normal TM noted.  Oropharynx moist,neck is supple Cardiovascular:  RRR with 2/6 murmur Respiratory:  Good breath sounds bilaterally, CTAB with normal respiratory effort Skin:  Warm, no rashes Extremities:  +1 pitting edema bilaterally   Lab Results  Component Value Date   CHOL 205 (H) 06/08/2018   HDL 56.00 06/08/2018   LDLCALC 128 (H) 06/08/2018   TRIG 109.0 06/08/2018   CHOLHDL 4 06/08/2018   Lab Results  Component Value Date   TSH 5.83 (H) 06/08/2018     Commons side effects, risks, benefits, and alternatives for medications and treatment plan prescribed today were discussed, and the patient expressed understanding of the given instructions. Patient is instructed to call or message via MyChart if he/she has any questions or concerns regarding our treatment plan. No barriers to understanding were identified. We discussed Red Flag symptoms and signs in detail. Patient expressed understanding regarding what to do in case of urgent or emergency type symptoms.   Medication list was reconciled, printed and provided to the patient in AVS.  Patient instructions and summary information was reviewed with the patient as documented in the AVS. This note was prepared with assistance of Dragon voice recognition software. Occasional wrong-word or sound-a-like substitutions may have occurred due to the inherent limitations of voice recognition software

## 2018-07-02 NOTE — Patient Instructions (Signed)
Please return in 3 months for Recheck of CHF, headaches, and blood pressure  If you have any questions or concerns, please don't hesitate to send me a message via MyChart or call the office at [phone]. Thank you for visiting with us today! It's our pleasure caring for you.  Today we discussed your cholesterol and agree that we will not start a new medication for this.   You may cut the elavil in half if needed for headaches or sleep. Please keep to a low salt diet to help with swelling in the legs.

## 2018-08-04 ENCOUNTER — Other Ambulatory Visit: Payer: Self-pay | Admitting: Family Medicine

## 2018-08-12 ENCOUNTER — Other Ambulatory Visit: Payer: Self-pay | Admitting: Family Medicine

## 2018-10-06 ENCOUNTER — Other Ambulatory Visit: Payer: Self-pay | Admitting: Family Medicine

## 2018-10-08 NOTE — Telephone Encounter (Signed)
Please call to schedule f/u: virtual visit now if able; otherwise can schedule in office in 4-6 weeks for f/u.

## 2018-10-08 NOTE — Telephone Encounter (Signed)
Due for f/u do you want in office or virtual

## 2018-10-08 NOTE — Telephone Encounter (Signed)
Please see below.

## 2018-10-12 ENCOUNTER — Encounter: Payer: Self-pay | Admitting: Family Medicine

## 2018-10-12 ENCOUNTER — Ambulatory Visit (INDEPENDENT_AMBULATORY_CARE_PROVIDER_SITE_OTHER): Payer: Medicare Other | Admitting: Family Medicine

## 2018-10-12 ENCOUNTER — Other Ambulatory Visit: Payer: Self-pay

## 2018-10-12 VITALS — Wt 119.0 lb

## 2018-10-12 DIAGNOSIS — I5032 Chronic diastolic (congestive) heart failure: Secondary | ICD-10-CM

## 2018-10-12 DIAGNOSIS — I10 Essential (primary) hypertension: Secondary | ICD-10-CM

## 2018-10-12 DIAGNOSIS — N183 Chronic kidney disease, stage 3 unspecified: Secondary | ICD-10-CM

## 2018-10-12 DIAGNOSIS — G44039 Episodic paroxysmal hemicrania, not intractable: Secondary | ICD-10-CM

## 2018-10-12 DIAGNOSIS — M1A0720 Idiopathic chronic gout, left ankle and foot, without tophus (tophi): Secondary | ICD-10-CM

## 2018-10-12 MED ORDER — FUROSEMIDE 40 MG PO TABS: 20.0000 mg | ORAL_TABLET | Freq: Every day | ORAL | 3 refills | Status: DC

## 2018-10-12 NOTE — Assessment & Plan Note (Signed)
Stable. Manages with neck brace.

## 2018-10-12 NOTE — Assessment & Plan Note (Signed)
Has been well controlled on CCB and diuretics. No sxs of low bps. Continue same.

## 2018-10-12 NOTE — Progress Notes (Signed)
Virtual Visit via Video Note  Subjective  CC:  Chief Complaint  Patient presents with  . Hyperlipidemia  . Hypothyroidism     I connected with Minerva Areola on 10/12/18 at  9:40 AM EDT by a video enabled telemedicine application and verified that I am speaking with the correct person using two identifiers. Location patient: Home Location provider: [facility] Persons participating in the virtual visit: Monica Caldwell, Willow Ora, MD Rita Ohara, CMA  I discussed the limitations of evaluation and management by telemedicine and the availability of in person appointments. The patient expressed understanding and agreed to proceed. HPI: Monica Caldwell is a 85 y.o. female who was contacted today to address the problems listed above in the chief complaint/HTN f/u:   Hypertension f/u: Control is good . Pt reports she is doing well. No sxs of lightheadedness. Hasn't checked bps. Feeling well. Taking medications w/o adverse effects. No symptoms of CHF, angina; no palpitations, sob, cp or lower extremity edema. Compliant with meds.  She denies adverse effects from his BP medications. Compliance with medication is good.   CHF and leg swelling: doing very well. Less swelling; continues on lasix, Kdur and maxzide. Weight is down; eating well. Last creat was mildly worse.   Energy level is good. Feeling well. She is 84 today! Sleeps a lot per her family members. Headaches persist and are mild. No recent gout flares.   BP Readings from Last 3 Encounters:  07/02/18 124/80  06/08/18 100/76  02/08/18 118/82   Wt Readings from Last 3 Encounters:  10/12/18 119 lb (54 kg)  07/02/18 133 lb 9.6 oz (60.6 kg)  06/08/18 126 lb 3.2 oz (57.2 kg)    Lab Results  Component Value Date   CHOL 205 (H) 06/08/2018   CHOL 165 06/29/2017   Lab Results  Component Value Date   HDL 56.00 06/08/2018   HDL 49.90 06/29/2017   Lab Results  Component Value  Date   LDLCALC 128 (H) 06/08/2018   LDLCALC 98 06/29/2017   Lab Results  Component Value Date   TRIG 109.0 06/08/2018   TRIG 88.0 06/29/2017   Lab Results  Component Value Date   CHOLHDL 4 06/08/2018   CHOLHDL 3 06/29/2017   No results found for: LDLDIRECT Lab Results  Component Value Date   CREATININE 1.83 (H) 06/08/2018   BUN 55 (H) 06/08/2018   NA 145 06/08/2018   K 4.4 06/08/2018   CL 103 06/08/2018   CO2 30 06/08/2018    The ASCVD Risk score (Goff DC Jr., et al., 2013) failed to calculate for the following reasons:   The 2013 ASCVD risk score is only valid for ages 90 to 75  Assessment  1. Stage 3 chronic kidney disease (HCC)   2. Diastolic CHF, chronic (HCC)   3. Hypertension, essential, benign   4. Idiopathic chronic gout of left foot without tophus   5. Episodic paroxysmal hemicrania, not intractable      Plan   See below for problem based assessment and plan documentation  I discussed the assessment and treatment plan with the patient. The patient was provided an opportunity to ask questions and all were answered. The patient agreed with the plan and demonstrated an understanding of the instructions.   The patient was advised to call back or seek an in-person evaluation if the symptoms worsen or if the condition fails to improve as anticipated. Follow up: Return in  about 3 months (around 01/12/2019) for follow up Hypertension, recheck.  Visit date not found  Meds ordered this encounter  Medications  . furosemide (LASIX) 40 MG tablet    Sig: Take 0.5 tablets (20 mg total) by mouth daily.    Dispense:  90 tablet    Refill:  3      I reviewed the patients updated PMH, FH, and SocHx.    Patient Active Problem List   Diagnosis Date Noted  . Moderate persistent asthma 06/29/2017    Priority: High  . Hypertension, essential, benign 12/20/2016    Priority: High  . CKD (chronic kidney disease) 12/20/2016    Priority: High  . Diastolic CHF, chronic (HCC)  09/22/2016    Priority: High  . Osteoporosis 12/21/2011    Priority: High  . Mixed hyperlipidemia 12/21/2011    Priority: High  . Nonintractable episodic headache 09/07/2017    Priority: Medium  . History of femur fracture 06/29/2017    Priority: Medium  . Peripheral neuropathy 06/29/2017    Priority: Medium  . Chronic gout of left foot 12/20/2016    Priority: Medium  . Osteoarthritis, multiple sites     Priority: Low  . RBBB 09/22/2016    Priority: Low  . Vitamin D deficiency 12/21/2011    Priority: Low   Current Meds  Medication Sig  . albuterol (PROAIR HFA) 108 (90 Base) MCG/ACT inhaler INHALE 2 PUFFS BY MOUTH EVERY 4 HOURS AS NEEDED FOR RESCUE FROM WHEEZING OR SHORTNESS OF BREATH  . amitriptyline (ELAVIL) 25 MG tablet Take 1 tablet (25 mg total) by mouth at bedtime.  Marland Kitchen. diltiazem (CARDIZEM CD) 120 MG 24 hr capsule TAKE 1 CAPSULE BY MOUTH EVERY DAY  . febuxostat (ULORIC) 40 MG tablet Take 1 tablet (40 mg total) by mouth daily.  . fluticasone-salmeterol (ADVAIR HFA) 115-21 MCG/ACT inhaler Inhale 2 puffs into the lungs 2 (two) times daily.  . furosemide (LASIX) 40 MG tablet Take 0.5 tablets (20 mg total) by mouth daily.  . indomethacin (INDOCIN) 25 MG capsule Take 25 mg by mouth as needed.  . potassium chloride SA (K-DUR,KLOR-CON) 20 MEQ tablet Take 1 tablet (20 mEq total) by mouth daily.  Marland Kitchen. triamterene-hydrochlorothiazide (MAXZIDE-25) 37.5-25 MG tablet TAKE 1 TABLET BY MOUTH DAILY  . [DISCONTINUED] furosemide (LASIX) 40 MG tablet TAKE 1 TABLET BY MOUTH DAILY    Allergies: Patient has No Known Allergies. Family History: Patient family history includes CAD in her mother; COPD in her brother and mother; Cancer in her mother; Heart attack in her brother; Heart disease in her brother; Hyperlipidemia in her brother; Hypertension in her brother; Lung cancer in her mother. Social History:  Patient  reports that she has never smoked. She has never used smokeless tobacco. She reports  that she does not drink alcohol or use drugs.  Review of Systems: Constitutional: Negative for fever malaise or anorexia Cardiovascular: negative for chest pain Respiratory: negative for SOB or persistent cough Gastrointestinal: negative for abdominal pain  OBJECTIVE Vitals: Wt 119 lb (54 kg)   BMI 22.48 kg/m  General: no acute distress , A&Ox3  Willow Oraamille L Andy, MD

## 2018-10-12 NOTE — Assessment & Plan Note (Signed)
On uloric and has improved. Last uric acid was at goal: 5

## 2018-10-12 NOTE — Assessment & Plan Note (Signed)
In January mild increase of creat from baseline; weight is down and swelling is down. Recommend decreasing lasix to 20mg  daily and continue maxzide. Family members will monitor her edema and let me know if it is worsening or if weight is increasing significantly. Will defer blood work for 3 months.

## 2018-10-12 NOTE — Assessment & Plan Note (Signed)
As above, monitoring swelling and adjusting down diuretics. Monitor weight. Low salt diet.

## 2018-10-18 ENCOUNTER — Telehealth: Payer: Self-pay | Admitting: *Deleted

## 2018-10-18 NOTE — Telephone Encounter (Signed)
error 

## 2018-12-10 ENCOUNTER — Other Ambulatory Visit: Payer: Self-pay | Admitting: Family Medicine

## 2019-01-03 ENCOUNTER — Other Ambulatory Visit: Payer: Self-pay | Admitting: Family Medicine

## 2019-01-09 ENCOUNTER — Encounter: Payer: Self-pay | Admitting: Family Medicine

## 2019-01-14 ENCOUNTER — Ambulatory Visit: Payer: Medicare Other | Admitting: Family Medicine

## 2019-01-30 ENCOUNTER — Other Ambulatory Visit: Payer: Self-pay | Admitting: Family Medicine

## 2019-01-31 ENCOUNTER — Telehealth: Payer: Self-pay | Admitting: Family Medicine

## 2019-01-31 NOTE — Telephone Encounter (Signed)
I left a message asking the patient's daughter, Butch Penny to call me at [phone] to schedule AWV with Loma Sousa after her appt w/ Dr. Jonni Sanger on 8/31. VDM (Dee-Dee)

## 2019-02-04 ENCOUNTER — Encounter: Payer: Self-pay | Admitting: Family Medicine

## 2019-02-04 ENCOUNTER — Ambulatory Visit: Payer: Medicare Other

## 2019-02-04 ENCOUNTER — Other Ambulatory Visit: Payer: Self-pay

## 2019-02-04 ENCOUNTER — Ambulatory Visit (INDEPENDENT_AMBULATORY_CARE_PROVIDER_SITE_OTHER): Payer: Medicare Other | Admitting: Family Medicine

## 2019-02-04 VITALS — BP 112/68 | HR 99 | Temp 97.4°F | Resp 14 | Ht 61.0 in | Wt 128.2 lb

## 2019-02-04 DIAGNOSIS — G6289 Other specified polyneuropathies: Secondary | ICD-10-CM | POA: Diagnosis not present

## 2019-02-04 DIAGNOSIS — N183 Chronic kidney disease, stage 3 unspecified: Secondary | ICD-10-CM

## 2019-02-04 DIAGNOSIS — I10 Essential (primary) hypertension: Secondary | ICD-10-CM

## 2019-02-04 DIAGNOSIS — Z23 Encounter for immunization: Secondary | ICD-10-CM

## 2019-02-04 DIAGNOSIS — I5032 Chronic diastolic (congestive) heart failure: Secondary | ICD-10-CM | POA: Diagnosis not present

## 2019-02-04 DIAGNOSIS — M810 Age-related osteoporosis without current pathological fracture: Secondary | ICD-10-CM | POA: Diagnosis not present

## 2019-02-04 DIAGNOSIS — R7989 Other specified abnormal findings of blood chemistry: Secondary | ICD-10-CM | POA: Diagnosis not present

## 2019-02-04 LAB — COMPREHENSIVE METABOLIC PANEL
ALT: 10 U/L (ref 0–35)
AST: 16 U/L (ref 0–37)
Albumin: 4.3 g/dL (ref 3.5–5.2)
Alkaline Phosphatase: 60 U/L (ref 39–117)
BUN: 48 mg/dL — ABNORMAL HIGH (ref 6–23)
CO2: 35 mEq/L — ABNORMAL HIGH (ref 19–32)
Calcium: 9.9 mg/dL (ref 8.4–10.5)
Chloride: 99 mEq/L (ref 96–112)
Creatinine, Ser: 1.68 mg/dL — ABNORMAL HIGH (ref 0.40–1.20)
GFR: 28.73 mL/min — ABNORMAL LOW (ref >60.00)
Glucose, Bld: 87 mg/dL (ref 70–99)
Potassium: 4.4 mEq/L (ref 3.5–5.1)
Sodium: 144 mEq/L (ref 135–145)
Total Bilirubin: 0.5 mg/dL (ref 0.2–1.2)
Total Protein: 6.9 g/dL (ref 6.0–8.3)

## 2019-02-04 LAB — T4, FREE: Free T4: 1.08 ng/dL (ref 0.60–1.60)

## 2019-02-04 LAB — CBC WITH DIFFERENTIAL/PLATELET
Basophils Absolute: 0.1 10*3/uL (ref 0.0–0.1)
Basophils Relative: 0.8 % (ref 0.0–3.0)
Eosinophils Absolute: 0.1 10*3/uL (ref 0.0–0.7)
Eosinophils Relative: 1.7 % (ref 0.0–5.0)
HCT: 39.4 % (ref 36.0–46.0)
Hemoglobin: 13.3 g/dL (ref 12.0–15.0)
Lymphocytes Relative: 30.4 % (ref 12.0–46.0)
Lymphs Abs: 2.4 10*3/uL (ref 0.7–4.0)
MCHC: 33.7 g/dL (ref 30.0–36.0)
MCV: 92.7 fl (ref 78.0–100.0)
Monocytes Absolute: 0.5 10*3/uL (ref 0.1–1.0)
Monocytes Relative: 5.8 % (ref 3.0–12.0)
Neutro Abs: 4.8 10*3/uL (ref 1.4–7.7)
Neutrophils Relative %: 61.3 % (ref 43.0–77.0)
Platelets: 312 10*3/uL (ref 150.0–400.0)
RBC: 4.25 Mil/uL (ref 3.87–5.11)
RDW: 13.9 % (ref 11.5–15.5)
WBC: 7.8 10*3/uL (ref 4.0–10.5)

## 2019-02-04 LAB — TSH: TSH: 3.68 u[IU]/mL (ref 0.35–4.50)

## 2019-02-04 MED ORDER — DENOSUMAB 60 MG/ML ~~LOC~~ SOSY
60.0000 mg | PREFILLED_SYRINGE | Freq: Once | SUBCUTANEOUS | Status: AC
  Administered 2019-02-04: 10:00:00 60 mg via SUBCUTANEOUS

## 2019-02-04 NOTE — Patient Instructions (Addendum)
Please return in 3-6  months for recheck.  Return for nurse visit in 6 months for your Prolia injection.   Medicare recommends an Annual Wellness Visit for all patients. Please schedule this to be done with our Nurse Educator, Loma Sousa. This is an informative "talk" visit; it's goals are to ensure that your health care needs are being met and to give you education regarding avoiding falls, ensuring you are not suffering from depression or problems with memory or thinking, and to educate you on Advance Care Planning. It helps me take good care of you!  If you have any questions or concerns, please don't hesitate to send me a message via MyChart or call the office at [phone]. Thank you for visiting with [place] today! It's our pleasure caring for you.  Today you were given your flu vaccination.  I'm so glad you are doing well.

## 2019-02-04 NOTE — Progress Notes (Signed)
Subjective  CC:  Chief Complaint  Patient presents with  . Hyperlipidemia  . Hypertension  . Chronic Kidney Disease    HPI: Monica Caldwell is a 85 y.o. female who presents to the office today to address the problems listed above in the chief complaint.  Hypertension f/u: Control is good . Pt reports she is doing well. taking medications as instructed, no medication side effects noted, no TIAs, no chest pain on exertion, no dyspnea on exertion, no swelling of ankles. She denies adverse effects from his BP medications. Compliance with medication is good.   CKD: doing well. Mild edema that resolves with leg elevation since decreasing lasix dose due to bump in creatinine. No sob. No n/v. Energy is good.   Chronic problems are controlled. She feels well. No longer with frequent headaches. Eating well. Happy. No new concerns.   Assessment  1. Hypertension, essential, benign   2. Stage 3 chronic kidney disease (HCC)   3. Diastolic CHF, chronic (HCC)   4. Other polyneuropathy   5. Abnormal TSH      Plan    Hypertension f/u: BP control is well controlled. No med changes  Hyperlipidemia f/u: check labs on statin  Edema/chf: well compensated.   ckd check labs.   F/u on abnl tsh today. Clinically ok.  Vaccinations:flu today  prolia injection given today for osteoporosis.  Education regarding management of these chronic disease states was given. Management strategies discussed on successive visits include dietary and exercise recommendations, goals of achieving and maintaining IBW, and lifestyle modifications aiming for adequate sleep and minimizing stressors.   Follow up: Return in about 3 months (around 05/06/2019) for recheck, AWV at patient's convenience.  Orders Placed This Encounter  Procedures  . Comprehensive metabolic panel  . CBC with Differential/Platelet  . TSH  . T3  . T4, free   No orders of the defined types were placed in this encounter.     BP  Readings from Last 3 Encounters:  02/04/19 112/68  07/02/18 124/80  06/08/18 100/76   Wt Readings from Last 3 Encounters:  02/04/19 128 lb 3.2 oz (58.2 kg)  10/12/18 119 lb (54 kg)  07/02/18 133 lb 9.6 oz (60.6 kg)    Lab Results  Component Value Date   CHOL 205 (H) 06/08/2018   CHOL 165 06/29/2017   Lab Results  Component Value Date   HDL 56.00 06/08/2018   HDL 49.90 06/29/2017   Lab Results  Component Value Date   LDLCALC 128 (H) 06/08/2018   LDLCALC 98 06/29/2017   Lab Results  Component Value Date   TRIG 109.0 06/08/2018   TRIG 88.0 06/29/2017   Lab Results  Component Value Date   CHOLHDL 4 06/08/2018   CHOLHDL 3 06/29/2017   No results found for: LDLDIRECT Lab Results  Component Value Date   CREATININE 1.83 (H) 06/08/2018   BUN 55 (H) 06/08/2018   NA 145 06/08/2018   K 4.4 06/08/2018   CL 103 06/08/2018   CO2 30 06/08/2018    The ASCVD Risk score (Goff DC Jr., et al., 2013) failed to calculate for the following reasons:   The 2013 ASCVD risk score is only valid for ages 5940 to 2479  I reviewed the patients updated PMH, FH, and SocHx.    Patient Active Problem List   Diagnosis Date Noted  . Moderate persistent asthma 06/29/2017    Priority: High  . Hypertension, essential, benign 12/20/2016    Priority: High  .  CKD (chronic kidney disease) 12/20/2016    Priority: High  . Diastolic CHF, chronic ([place]) 09/22/2016    Priority: High  . Osteoporosis 12/21/2011    Priority: High  . Mixed hyperlipidemia 12/21/2011    Priority: High  . Nonintractable episodic headache 09/07/2017    Priority: Medium  . History of femur fracture 06/29/2017    Priority: Medium  . Peripheral neuropathy 06/29/2017    Priority: Medium  . Chronic gout of left foot 12/20/2016    Priority: Medium  . Osteoarthritis, multiple sites     Priority: Low  . RBBB 09/22/2016    Priority: Low  . Vitamin D deficiency 12/21/2011    Priority: Low    Allergies: Patient has no known  allergies.  Social History: Patient  reports that she has never smoked. She has never used smokeless tobacco. She reports that she does not drink alcohol or use drugs.  Current Meds  Medication Sig  . albuterol (VENTOLIN HFA) 108 (90 Base) MCG/ACT inhaler INHALE 2 PUFFS BY MOUTH EVERY 4 HOURS AS NEEDED FOR RESCUE FROM WHEEZING OR SHORTNESS OF BREATH  . amitriptyline (ELAVIL) 25 MG tablet Take 1 tablet (25 mg total) by mouth at bedtime.  Marland Kitchen diltiazem (CARDIZEM CD) 120 MG 24 hr capsule TAKE ONE CAPSULE BY MOUTH EVERY DAY  . febuxostat (ULORIC) 40 MG tablet TAKE 1 TABLET BY MOUTH DAILY  . fluticasone-salmeterol (ADVAIR HFA) 115-21 MCG/ACT inhaler Inhale 2 puffs into the lungs 2 (two) times daily.  . furosemide (LASIX) 40 MG tablet Take 0.5 tablets (20 mg total) by mouth daily.  . indomethacin (INDOCIN) 25 MG capsule Take 25 mg by mouth as needed.  . potassium chloride (MICRO-K) 10 MEQ CR capsule TAKE 2 CAPSULES BY MOUTH EVERY DAY  . potassium chloride SA (K-DUR,KLOR-CON) 20 MEQ tablet Take 1 tablet (20 mEq total) by mouth daily.  Marland Kitchen triamterene-hydrochlorothiazide (MAXZIDE-25) 37.5-25 MG tablet TAKE 1 TABLET BY MOUTH DAILY    Review of Systems: Cardiovascular: negative for chest pain, palpitations, leg swelling, orthopnea Respiratory: negative for SOB, wheezing or persistent cough Gastrointestinal: negative for abdominal pain Genitourinary: negative for dysuria or gross hematuria  Objective  Vitals: BP 112/68   Pulse 99   Temp (!) 97.4 F (36.3 C) (Tympanic)   Resp 14   Ht 5\' 1"  (1.549 m)   Wt 128 lb 3.2 oz (58.2 kg)   BMI 24.22 kg/m  General: no acute distress  Psych:  Alert and oriented, normal mood and affect HEENT:  Normocephalic, atraumatic, supple neck  Cardiovascular:  RRR without murmur. Tr edema Respiratory:  Good breath sounds bilaterally, CTAB with normal respiratory effort Skin:  Warm, no rashes Neurologic:   Mental status is normal  Commons side effects, risks,  benefits, and alternatives for medications and treatment plan prescribed today were discussed, and the patient expressed understanding of the given instructions. Patient is instructed to call or message via MyChart if he/she has any questions or concerns regarding our treatment plan. No barriers to understanding were identified. We discussed Red Flag symptoms and signs in detail. Patient expressed understanding regarding what to do in case of urgent or emergency type symptoms.   Medication list was reconciled, printed and provided to the patient in AVS. Patient instructions and summary information was reviewed with the patient as documented in the AVS. This note was prepared with assistance of Dragon voice recognition software. Occasional wrong-word or sound-a-like substitutions may have occurred due to the inherent limitations of voice recognition software

## 2019-02-05 LAB — T3: T3, Total: 111 ng/dL (ref 76–181)

## 2019-06-21 ENCOUNTER — Telehealth: Payer: Self-pay | Admitting: Family Medicine

## 2019-06-21 NOTE — Telephone Encounter (Signed)
I spoke with the patient's daughter, Lupita Leash, to schedule AWV-Initial with Toni Amend [facility]).  She said that she will call back when she has her calendar with her.  If she calls back, please schedule Medicare Annual Wellness Visit (initial) at next available opening.

## 2019-08-27 ENCOUNTER — Other Ambulatory Visit: Payer: Self-pay

## 2019-08-27 MED ORDER — TRIAMTERENE-HCTZ 37.5-25 MG PO TABS: 1.0000 | ORAL_TABLET | Freq: Every day | ORAL | 3 refills | Status: DC

## 2019-10-04 ENCOUNTER — Other Ambulatory Visit: Payer: Self-pay | Admitting: *Deleted

## 2019-10-04 MED ORDER — DILTIAZEM HCL ER COATED BEADS 120 MG PO CP24: 120.0000 mg | ORAL_CAPSULE | Freq: Every day | ORAL | 0 refills | Status: DC

## 2019-10-06 ENCOUNTER — Other Ambulatory Visit: Payer: Self-pay | Admitting: Family Medicine

## 2019-10-08 ENCOUNTER — Encounter: Payer: Self-pay | Admitting: Family Medicine

## 2019-10-11 ENCOUNTER — Other Ambulatory Visit: Payer: Self-pay | Admitting: Family Medicine

## 2019-10-28 ENCOUNTER — Other Ambulatory Visit: Payer: Self-pay | Admitting: Family Medicine

## 2019-10-30 ENCOUNTER — Other Ambulatory Visit: Payer: Self-pay

## 2019-10-30 MED ORDER — FLUTICASONE-SALMETEROL 115-21 MCG/ACT IN AERO: 2.0000 | INHALATION_SPRAY | Freq: Two times a day (BID) | RESPIRATORY_TRACT | 0 refills | Status: DC

## 2019-11-06 ENCOUNTER — Encounter: Payer: Self-pay | Admitting: Family Medicine

## 2019-11-15 ENCOUNTER — Encounter: Payer: Self-pay | Admitting: Family Medicine

## 2019-11-15 ENCOUNTER — Ambulatory Visit (INDEPENDENT_AMBULATORY_CARE_PROVIDER_SITE_OTHER): Payer: Medicare Other | Admitting: Family Medicine

## 2019-11-15 ENCOUNTER — Other Ambulatory Visit: Payer: Self-pay | Admitting: Family Medicine

## 2019-11-15 ENCOUNTER — Other Ambulatory Visit: Payer: Self-pay

## 2019-11-15 VITALS — BP 120/70 | HR 76 | Temp 97.4°F | Resp 18 | Ht 61.0 in | Wt 123.0 lb

## 2019-11-15 DIAGNOSIS — I5032 Chronic diastolic (congestive) heart failure: Secondary | ICD-10-CM

## 2019-11-15 DIAGNOSIS — I10 Essential (primary) hypertension: Secondary | ICD-10-CM | POA: Diagnosis not present

## 2019-11-15 DIAGNOSIS — E559 Vitamin D deficiency, unspecified: Secondary | ICD-10-CM

## 2019-11-15 DIAGNOSIS — G6289 Other specified polyneuropathies: Secondary | ICD-10-CM

## 2019-11-15 DIAGNOSIS — E782 Mixed hyperlipidemia: Secondary | ICD-10-CM | POA: Diagnosis not present

## 2019-11-15 DIAGNOSIS — M810 Age-related osteoporosis without current pathological fracture: Secondary | ICD-10-CM

## 2019-11-15 DIAGNOSIS — N183 Chronic kidney disease, stage 3 unspecified: Secondary | ICD-10-CM | POA: Diagnosis not present

## 2019-11-15 MED ORDER — FUROSEMIDE 40 MG PO TABS: 40.0000 mg | ORAL_TABLET | Freq: Every day | ORAL | 0 refills | Status: DC

## 2019-11-15 MED ORDER — DILTIAZEM HCL ER COATED BEADS 120 MG PO CP24: 120.0000 mg | ORAL_CAPSULE | Freq: Every day | ORAL | 0 refills | Status: DC

## 2019-11-15 NOTE — Patient Instructions (Signed)
Please return in 3 months for your physical exam and follow up.  Please schedule for prolia injection in 6 months.  Please schedule your bone density. I have ordered a mammogram and/or bone density for you as we discussed today: []   Mammogram  [x]   Bone Density  Please call the office checked below to schedule your appointment: Your appointment will at the following location  [x]   [facility]       [address] [place],        [address]         []   [facility]  [address] [place],  [facility]   If you have any questions or concerns, please don't hesitate to send me a message via MyChart or call the office at [phone]. Thank you for visiting with [place] today! It's our pleasure caring for you.

## 2019-11-15 NOTE — Progress Notes (Signed)
Subjective  CC:  Chief Complaint  Patient presents with  . Leg Swelling    Right leg x1 month. She has some pain when she sits for too long and goes to stand up.     HPI: Monica Caldwell is a 86 y.o. female who presents to the office today to address the problems listed above in the chief complaint.  Hypertension f/u: Control is good . Pt reports she is doing well. taking medications as instructed, no medication side effects noted, no TIAs, no chest pain on exertion, no dyspnea on exertion, + chronic swelling of ankles. Daughter reports bp is normal.  She denies adverse effects from his BP medications. Compliance with medication is good.   CKD: overdue for recheck. Swelling in legs worsened with dependency but improves if elevates legs. On lasix and maxzide. No sxs of infectin  Osteoporosis: overdue for prolia injection.   Neuropathy is stable.    Assessment  1. Hypertension, essential, benign   2. Age-related osteoporosis without current pathological fracture   3. Stage 3 chronic kidney disease, unspecified whether stage 3a or 3b CKD   4. Mixed hyperlipidemia   5. Other polyneuropathy   6. Vitamin D deficiency   7. Diastolic CHF, chronic (HCC)        Plan    Hypertension f/u: BP control is well controlled. No med changes. Check renal function and electrolytes.  Edema/chf: continue diuretics. Monitor renal function  Osteoporosis f/u: prolia today and recheck dexa scan. Check calcium and vit D  HLD on statin. Check nonfasting lipids and cmp.   Education regarding management of these chronic disease states was given. Management strategies discussed on successive visits include dietary and exercise recommendations, goals of achieving and maintaining IBW, and lifestyle modifications aiming for adequate sleep and minimizing stressors.   Follow up: 3 months for cpe  Orders Placed This Encounter  Procedures  . DG Bone Density  . PTH, intact (no Ca)  . VITAMIN D 25  Hydroxy (Vit-D Deficiency, Fractures)  . Magnesium  . Lipid panel  . Comprehensive metabolic panel  . CBC with Differential/Platelet  . TSH   Meds ordered this encounter  Medications  . furosemide (LASIX) 40 MG tablet    Sig: Take 1 tablet (40 mg total) by mouth daily. PATIENT NEEDS A FOLLOW UP APPT FOR FURTHER REFILLS.    Dispense:  30 tablet    Refill:  0    PATIENT IS NOT GETTING A 90 SUPPLY UNTIL SHE HAS A APPT. THANK YOU  . diltiazem (CARDIZEM CD) 120 MG 24 hr capsule    Sig: Take 1 capsule (120 mg total) by mouth daily.    Dispense:  30 capsule    Refill:  0    Pt needs Office visit for future refills      BP Readings from Last 3 Encounters:  11/15/19 120/70  02/04/19 112/68  07/02/18 124/80   Wt Readings from Last 3 Encounters:  11/15/19 123 lb (55.8 kg)  02/04/19 128 lb 3.2 oz (58.2 kg)  10/12/18 119 lb (54 kg)    Lab Results  Component Value Date   CHOL 163 11/15/2019   CHOL 205 (H) 06/08/2018   CHOL 165 06/29/2017   Lab Results  Component Value Date   HDL 62 11/15/2019   HDL 56.00 06/08/2018   HDL 49.90 06/29/2017   Lab Results  Component Value Date   LDLCALC 86 11/15/2019   LDLCALC 128 (H) 06/08/2018   LDLCALC 98 06/29/2017  Lab Results  Component Value Date   TRIG 66 11/15/2019   TRIG 109.0 06/08/2018   TRIG 88.0 06/29/2017   Lab Results  Component Value Date   CHOLHDL 2.6 11/15/2019   CHOLHDL 4 06/08/2018   CHOLHDL 3 06/29/2017   No results found for: LDLDIRECT Lab Results  Component Value Date   CREATININE 1.95 (H) 11/15/2019   BUN 64 (H) 11/15/2019   NA 139 11/15/2019   K 4.2 11/15/2019   CL 97 (L) 11/15/2019   CO2 30 11/15/2019    The ASCVD Risk score (Goff DC Jr., et al., 2013) failed to calculate for the following reasons:   The 2013 ASCVD risk score is only valid for ages 61 to 37  I reviewed the patients updated PMH, FH, and SocHx.    Patient Active Problem List   Diagnosis Date Noted  . Moderate persistent asthma  06/29/2017    Priority: High  . Hypertension, essential, benign 12/20/2016    Priority: High  . CKD (chronic kidney disease) 12/20/2016    Priority: High  . Diastolic CHF, chronic (HCC) 09/22/2016    Priority: High  . Osteoporosis 12/21/2011    Priority: High  . Mixed hyperlipidemia 12/21/2011    Priority: High  . Nonintractable episodic headache 09/07/2017    Priority: Medium  . History of femur fracture 06/29/2017    Priority: Medium  . Peripheral neuropathy 06/29/2017    Priority: Medium  . Chronic gout of left foot 12/20/2016    Priority: Medium  . Osteoarthritis, multiple sites     Priority: Low  . RBBB 09/22/2016    Priority: Low  . Vitamin D deficiency 12/21/2011    Priority: Low    Allergies: Patient has no known allergies.  Social History: Patient  reports that she has never smoked. She has never used smokeless tobacco. She reports that she does not drink alcohol and does not use drugs.  Current Meds  Medication Sig  . albuterol (VENTOLIN HFA) 108 (90 Base) MCG/ACT inhaler INHALE 2 PUFFS BY MOUTH EVERY 4 HOURS AS NEEDED FOR RESCUE FROM WHEEZING OR SHORTNESS OF BREATH  . amitriptyline (ELAVIL) 25 MG tablet Take 1 tablet (25 mg total) by mouth at bedtime.  Marland Kitchen diltiazem (CARDIZEM CD) 120 MG 24 hr capsule Take 1 capsule (120 mg total) by mouth daily.  . febuxostat (ULORIC) 40 MG tablet TAKE 1 TABLET BY MOUTH DAILY  . furosemide (LASIX) 40 MG tablet Take 1 tablet (40 mg total) by mouth daily. PATIENT NEEDS A FOLLOW UP APPT FOR FURTHER REFILLS.  Marland Kitchen indomethacin (INDOCIN) 25 MG capsule Take 25 mg by mouth as needed.  . potassium chloride (MICRO-K) 10 MEQ CR capsule TAKE 2 CAPSULES BY MOUTH EVERY DAY  . triamterene-hydrochlorothiazide (MAXZIDE-25) 37.5-25 MG tablet Take 1 tablet by mouth daily.  . [DISCONTINUED] diltiazem (CARDIZEM CD) 120 MG 24 hr capsule Take 1 capsule (120 mg total) by mouth daily.  . [DISCONTINUED] furosemide (LASIX) 40 MG tablet Take 1 tablet (40 mg  total) by mouth daily. PATIENT NEEDS A FOLLOW UP APPT FOR FURTHER REFILLS.    Review of Systems: Cardiovascular: negative for chest pain, palpitations, leg swelling, orthopnea Respiratory: negative for SOB, wheezing or persistent cough Gastrointestinal: negative for abdominal pain Genitourinary: negative for dysuria or gross hematuria  Objective  Vitals: BP 120/70   Pulse 76   Temp (!) 97.4 F (36.3 C) (Temporal)   Resp 18   Ht 5\' 1"  (1.549 m)   Wt 123 lb (55.8 kg)  SpO2 99%   BMI 23.24 kg/m  General: no acute distress  Psych:  Alert and oriented, normal mood and affect HEENT:  Normocephalic, atraumatic, supple neck  Cardiovascular:  RRR +1 pitting edema bilaterally Respiratory:  Good breath sounds bilaterally, CTAB with normal respiratory effort Skin:  Warm, no rashes Neurologic:   Mental status is normal  Commons side effects, risks, benefits, and alternatives for medications and treatment plan prescribed today were discussed, and the patient expressed understanding of the given instructions. Patient is instructed to call or message via MyChart if he/she has any questions or concerns regarding our treatment plan. No barriers to understanding were identified. We discussed Red Flag symptoms and signs in detail. Patient expressed understanding regarding what to do in case of urgent or emergency type symptoms.   Medication list was reconciled, printed and provided to the patient in AVS. Patient instructions and summary information was reviewed with the patient as documented in the AVS. This note was prepared with assistance of Dragon voice recognition software. Occasional wrong-word or sound-a-like substitutions may have occurred due to the inherent limitations of voice recognition software  This visit occurred during the SARS-CoV-2 public health emergency.  Safety protocols were in place, including screening questions prior to the visit, additional usage of staff PPE, and extensive  cleaning of exam room while observing appropriate contact time as indicated for disinfecting solutions.

## 2019-11-18 LAB — CBC WITH DIFFERENTIAL/PLATELET
Absolute Monocytes: 632 cells/uL (ref 200–950)
Basophils Absolute: 80 cells/uL (ref 0–200)
Basophils Relative: 1 %
Eosinophils Absolute: 200 cells/uL (ref 15–500)
Eosinophils Relative: 2.5 %
HCT: 39.2 % (ref 35.0–45.0)
Hemoglobin: 13 g/dL (ref 11.7–15.5)
Lymphs Abs: 3064 cells/uL (ref 850–3900)
MCH: 30 pg (ref 27.0–33.0)
MCHC: 33.2 g/dL (ref 32.0–36.0)
MCV: 90.3 fL (ref 80.0–100.0)
MPV: 10 fL (ref 7.5–12.5)
Monocytes Relative: 7.9 %
Neutro Abs: 4024 cells/uL (ref 1500–7800)
Neutrophils Relative %: 50.3 %
Platelets: 311 10*3/uL (ref 140–400)
RBC: 4.34 10*6/uL (ref 3.80–5.10)
RDW: 12.8 % (ref 11.0–15.0)
Total Lymphocyte: 38.3 %
WBC: 8 10*3/uL (ref 3.8–10.8)

## 2019-11-18 LAB — LIPID PANEL
Cholesterol: 163 mg/dL (ref <200)
HDL: 62 mg/dL (ref >=50)
LDL Cholesterol (Calc): 86 mg/dL (calc)
Non-HDL Cholesterol (Calc): 101 mg/dL (calc) (ref <130)
Total CHOL/HDL Ratio: 2.6 (calc) (ref <5.0)
Triglycerides: 66 mg/dL (ref <150)

## 2019-11-18 LAB — COMPREHENSIVE METABOLIC PANEL
AG Ratio: 1.4 (calc) (ref 1.0–2.5)
ALT: 15 U/L (ref 6–29)
AST: 17 U/L (ref 10–35)
Albumin: 4.2 g/dL (ref 3.6–5.1)
Alkaline phosphatase (APISO): 56 U/L (ref 37–153)
BUN/Creatinine Ratio: 33 (calc) — ABNORMAL HIGH (ref 6–22)
BUN: 64 mg/dL — ABNORMAL HIGH (ref 7–25)
CO2: 30 mmol/L (ref 20–32)
Calcium: 9.8 mg/dL (ref 8.6–10.4)
Chloride: 97 mmol/L — ABNORMAL LOW (ref 98–110)
Creat: 1.95 mg/dL — ABNORMAL HIGH (ref 0.60–0.88)
Globulin: 2.9 g/dL (calc) (ref 1.9–3.7)
Glucose, Bld: 95 mg/dL (ref 65–99)
Potassium: 4.2 mmol/L (ref 3.5–5.3)
Sodium: 139 mmol/L (ref 135–146)
Total Bilirubin: 0.4 mg/dL (ref 0.2–1.2)
Total Protein: 7.1 g/dL (ref 6.1–8.1)

## 2019-11-18 LAB — VITAMIN D 25 HYDROXY (VIT D DEFICIENCY, FRACTURES): Vit D, 25-Hydroxy: 34 ng/mL (ref 30–100)

## 2019-11-18 LAB — PARATHYROID HORMONE, INTACT (NO CA): PTH: 5 pg/mL — ABNORMAL LOW (ref 14–64)

## 2019-11-18 LAB — MAGNESIUM: Magnesium: 2.6 mg/dL — ABNORMAL HIGH (ref 1.5–2.5)

## 2019-11-18 LAB — TSH: TSH: 4.32 mIU/L (ref 0.40–4.50)

## 2019-11-21 NOTE — Progress Notes (Signed)
Please call patient: I have reviewed his/her lab results. Labs show mildly worsening renal function but other labs are stable. No changes needed at this time. Please recheck in 3 months as recommended (OV). Please get her to schedule her f/u visit.

## 2019-11-28 ENCOUNTER — Other Ambulatory Visit: Payer: Self-pay | Admitting: Family Medicine

## 2020-01-27 ENCOUNTER — Other Ambulatory Visit: Payer: Self-pay | Admitting: Family Medicine

## 2020-01-28 ENCOUNTER — Other Ambulatory Visit: Payer: Self-pay | Admitting: Family Medicine

## 2020-02-04 ENCOUNTER — Other Ambulatory Visit: Payer: Self-pay | Admitting: Family Medicine

## 2020-02-17 ENCOUNTER — Encounter: Payer: Self-pay | Admitting: Family Medicine

## 2020-02-17 ENCOUNTER — Other Ambulatory Visit: Payer: Self-pay

## 2020-02-17 ENCOUNTER — Telehealth (INDEPENDENT_AMBULATORY_CARE_PROVIDER_SITE_OTHER): Payer: Medicare Other | Admitting: Family Medicine

## 2020-02-17 DIAGNOSIS — G6289 Other specified polyneuropathies: Secondary | ICD-10-CM

## 2020-02-17 DIAGNOSIS — M810 Age-related osteoporosis without current pathological fracture: Secondary | ICD-10-CM

## 2020-02-17 DIAGNOSIS — I5032 Chronic diastolic (congestive) heart failure: Secondary | ICD-10-CM

## 2020-02-17 DIAGNOSIS — N1832 Chronic kidney disease, stage 3b: Secondary | ICD-10-CM | POA: Diagnosis not present

## 2020-02-17 DIAGNOSIS — I10 Essential (primary) hypertension: Secondary | ICD-10-CM

## 2020-02-17 DIAGNOSIS — M8949 Other hypertrophic osteoarthropathy, multiple sites: Secondary | ICD-10-CM

## 2020-02-17 DIAGNOSIS — M159 Polyosteoarthritis, unspecified: Secondary | ICD-10-CM

## 2020-02-17 MED ORDER — DILTIAZEM HCL ER COATED BEADS 120 MG PO CP24: 120.0000 mg | ORAL_CAPSULE | Freq: Every day | ORAL | 3 refills | Status: DC

## 2020-02-17 NOTE — Progress Notes (Signed)
Subjective  CC:  Chief Complaint  Patient presents with  . Hypertension  Virtual Visit via Video Note I connected with Minerva Areola on 02/17/20 at  3:30 PM EDT by a video enabled telemedicine application and verified that I am speaking with the correct person using two identifiers. Location patient: Home Location provider: [facility] Persons participating in the virtual visit: ALEEA HENDRY, Willow Ora, MD Adela Glimpse, CMA  I discussed the limitations of evaluation and management by telemedicine and the availability of in person appointments. The patient expressed understanding and agreed to proceed.    HPI: Monica Caldwell is a 86 y.o. female who presents to the office today to address the problems listed above in the chief complaint.  Hypertension f/u: Control is reported as good but doesn't check home readings. feels fine.. Pt reports she is doing well. taking medications as instructed, no medication side effects noted, no TIAs, no chest pain on exertion, no dyspnea on exertion, no swelling of ankles. She denies adverse effects from his BP medications. Compliance with medication is good.   Now her grandson and his spouse are living with her to help care for her.   Chronic chf and edema; reports it is stable on meds  CKD with bump in creatinine at last check. No sxs of nausea, sob, or worsening swelling.   Osteoporosis: had been on prolia: received injections in 2019 and once in 2020 then lost to follow up. Last dexa 2019  No longer having headaches.   Feels pretty good  Assessment  1. Hypertension, essential, benign   2. Stage 3b chronic kidney disease   3. Diastolic CHF, chronic (HCC)   4. Age-related osteoporosis without current pathological fracture   5. Primary osteoarthritis involving multiple joints   6. Other polyneuropathy      Plan    Hypertension f/u: BP control is well controlled. Will recheck in office at  next visit. Refilled cardizem  Edema: continue lasix as needed and elevation  Ckd: with mild worsening but stable clinically. Recheck at next visit  Osteoporosis: discussed possibility of restarting prolia injections. To discuss with her daughter Lupita Leash. May defer until after covid.   Education regarding management of these chronic disease states was given. Management strategies discussed on successive visits include dietary and exercise recommendations, goals of achieving and maintaining IBW, and lifestyle modifications aiming for adequate sleep and minimizing stressors.   Follow up: recheck in office in 3-6 months.  No orders of the defined types were placed in this encounter.  No orders of the defined types were placed in this encounter.     BP Readings from Last 3 Encounters:  11/15/19 120/70  02/04/19 112/68  07/02/18 124/80   Wt Readings from Last 3 Encounters:  11/15/19 123 lb (55.8 kg)  02/04/19 128 lb 3.2 oz (58.2 kg)  10/12/18 119 lb (54 kg)    Lab Results  Component Value Date   CHOL 163 11/15/2019   CHOL 205 (H) 06/08/2018   CHOL 165 06/29/2017   Lab Results  Component Value Date   HDL 62 11/15/2019   HDL 56.00 06/08/2018   HDL 49.90 06/29/2017   Lab Results  Component Value Date   LDLCALC 86 11/15/2019   LDLCALC 128 (H) 06/08/2018   LDLCALC 98 06/29/2017   Lab Results  Component Value Date   TRIG 66 11/15/2019   TRIG 109.0 06/08/2018   TRIG 88.0 06/29/2017   Lab Results  Component Value Date   CHOLHDL 2.6 11/15/2019   CHOLHDL 4 06/08/2018   CHOLHDL 3 06/29/2017   No results found for: LDLDIRECT Lab Results  Component Value Date   CREATININE 1.95 (H) 11/15/2019   BUN 64 (H) 11/15/2019   NA 139 11/15/2019   K 4.2 11/15/2019   CL 97 (L) 11/15/2019   CO2 30 11/15/2019    The ASCVD Risk score (Goff DC Jr., et al., 2013) failed to calculate for the following reasons:   The 2013 ASCVD risk score is only valid for ages 52 to 18  I reviewed  the patients updated PMH, FH, and SocHx.    Patient Active Problem List   Diagnosis Date Noted  . Moderate persistent asthma 06/29/2017    Priority: High  . Hypertension, essential, benign 12/20/2016    Priority: High  . CKD (chronic kidney disease) 12/20/2016    Priority: High  . Diastolic CHF, chronic (HCC) 09/22/2016    Priority: High  . Osteoporosis 12/21/2011    Priority: High  . Mixed hyperlipidemia 12/21/2011    Priority: High  . Nonintractable episodic headache 09/07/2017    Priority: Medium  . History of femur fracture 06/29/2017    Priority: Medium  . Peripheral neuropathy 06/29/2017    Priority: Medium  . Chronic gout of left foot 12/20/2016    Priority: Medium  . Osteoarthritis, multiple sites     Priority: Low  . RBBB 09/22/2016    Priority: Low  . Vitamin D deficiency 12/21/2011    Priority: Low    Allergies: Patient has no known allergies.  Social History: Patient  reports that she has never smoked. She has never used smokeless tobacco. She reports that she does not drink alcohol and does not use drugs.  Current Meds  Medication Sig  . ADVAIR HFA 115-21 MCG/ACT inhaler INHALE 2 PUFFS INTO THE LUNGS TWICE DAILY.*NEEDS APPT [phone]*  . albuterol (VENTOLIN HFA) 108 (90 Base) MCG/ACT inhaler INHALE 2 PUFFS BY MOUTH EVERY 4 HOURS AS NEEDED FOR RESCUE FROM WHEEZING OR SHORTNESS OF BREATH  . diltiazem (CARDIZEM CD) 120 MG 24 hr capsule TAKE 1 CAPSULE(120 MG) BY MOUTH DAILY  . febuxostat (ULORIC) 40 MG tablet TAKE 1 TABLET BY MOUTH DAILY  . furosemide (LASIX) 40 MG tablet TAKE 1 TABLET(40 MG) BY MOUTH DAILY. FOLLOW UP APPOINTMENT FOR FURTHER REFILLS  . potassium chloride (MICRO-K) 10 MEQ CR capsule TAKE 2 CAPSULES BY MOUTH EVERY DAY  . triamterene-hydrochlorothiazide (MAXZIDE-25) 37.5-25 MG tablet Take 1 tablet by mouth daily.    Review of Systems: Cardiovascular: negative for chest pain, palpitations, leg swelling, orthopnea Respiratory: negative for  SOB, wheezing or persistent cough Gastrointestinal: negative for abdominal pain Genitourinary: negative for dysuria or gross hematuria  Objective  Vitals: There were no vitals taken for this visit. General: no acute distress    Medication list was reconciled, printed and provided to the patient in AVS. Patient instructions and summary information was reviewed with the patient as documented in the AVS. This note was prepared with assistance of Dragon voice recognition software. Occasional wrong-word or sound-a-like substitutions may have occurred due to the inherent limitations of voice recognition software  This visit occurred during the SARS-CoV-2 public health emergency.  Safety protocols were in place, including screening questions prior to the visit, additional usage of staff PPE, and extensive cleaning of exam room while observing appropriate contact time as indicated for disinfecting solutions.

## 2020-02-17 NOTE — Telephone Encounter (Signed)
Video visit is fine.  Thanks.

## 2020-04-06 ENCOUNTER — Other Ambulatory Visit: Payer: Self-pay | Admitting: Family Medicine

## 2020-04-10 ENCOUNTER — Encounter: Payer: Medicare Other | Admitting: Family Medicine

## 2020-06-06 ENCOUNTER — Other Ambulatory Visit: Payer: Self-pay | Admitting: Family Medicine

## 2020-06-08 ENCOUNTER — Other Ambulatory Visit: Payer: Self-pay | Admitting: Family Medicine

## 2020-06-10 ENCOUNTER — Other Ambulatory Visit: Payer: Self-pay | Admitting: Family Medicine

## 2020-07-13 ENCOUNTER — Encounter: Payer: Medicare Other | Admitting: Family Medicine

## 2020-07-14 ENCOUNTER — Other Ambulatory Visit: Payer: Self-pay | Admitting: Family Medicine

## 2020-07-27 ENCOUNTER — Other Ambulatory Visit: Payer: Self-pay | Admitting: Family Medicine

## 2020-07-30 ENCOUNTER — Encounter: Payer: Self-pay | Admitting: Family Medicine

## 2020-09-24 ENCOUNTER — Encounter: Payer: Self-pay | Admitting: Family Medicine

## 2020-10-02 ENCOUNTER — Other Ambulatory Visit: Payer: Self-pay

## 2020-10-02 ENCOUNTER — Encounter: Payer: Self-pay | Admitting: Family Medicine

## 2020-10-02 ENCOUNTER — Ambulatory Visit (INDEPENDENT_AMBULATORY_CARE_PROVIDER_SITE_OTHER): Payer: Medicare Other | Admitting: Family Medicine

## 2020-10-02 ENCOUNTER — Ambulatory Visit (INDEPENDENT_AMBULATORY_CARE_PROVIDER_SITE_OTHER): Payer: Medicare Other

## 2020-10-02 VITALS — BP 126/80 | HR 74 | Temp 97.3°F | Resp 15 | Ht 61.0 in | Wt 123.0 lb

## 2020-10-02 DIAGNOSIS — R531 Weakness: Secondary | ICD-10-CM | POA: Diagnosis not present

## 2020-10-02 DIAGNOSIS — N1832 Chronic kidney disease, stage 3b: Secondary | ICD-10-CM

## 2020-10-02 DIAGNOSIS — I5032 Chronic diastolic (congestive) heart failure: Secondary | ICD-10-CM | POA: Diagnosis not present

## 2020-10-02 DIAGNOSIS — R410 Disorientation, unspecified: Secondary | ICD-10-CM

## 2020-10-02 DIAGNOSIS — E782 Mixed hyperlipidemia: Secondary | ICD-10-CM | POA: Diagnosis not present

## 2020-10-02 DIAGNOSIS — M810 Age-related osteoporosis without current pathological fracture: Secondary | ICD-10-CM

## 2020-10-02 DIAGNOSIS — R8290 Unspecified abnormal findings in urine: Secondary | ICD-10-CM | POA: Diagnosis not present

## 2020-10-02 DIAGNOSIS — I10 Essential (primary) hypertension: Secondary | ICD-10-CM

## 2020-10-02 LAB — CBC WITH DIFFERENTIAL/PLATELET
Basophils Absolute: 0.1 10*3/uL (ref 0.0–0.1)
Basophils Relative: 0.6 % (ref 0.0–3.0)
Eosinophils Absolute: 0.2 10*3/uL (ref 0.0–0.7)
Eosinophils Relative: 2.3 % (ref 0.0–5.0)
HCT: 40 % (ref 36.0–46.0)
Hemoglobin: 13.4 g/dL (ref 12.0–15.0)
Lymphocytes Relative: 26.5 % (ref 12.0–46.0)
Lymphs Abs: 2.4 10*3/uL (ref 0.7–4.0)
MCHC: 33.5 g/dL (ref 30.0–36.0)
MCV: 90.6 fl (ref 78.0–100.0)
Monocytes Absolute: 0.6 10*3/uL (ref 0.1–1.0)
Monocytes Relative: 6.5 % (ref 3.0–12.0)
Neutro Abs: 5.8 10*3/uL (ref 1.4–7.7)
Neutrophils Relative %: 64.1 % (ref 43.0–77.0)
Platelets: 484 10*3/uL — ABNORMAL HIGH (ref 150.0–400.0)
RBC: 4.42 Mil/uL (ref 3.87–5.11)
RDW: 13.7 % (ref 11.5–15.5)
WBC: 9 10*3/uL (ref 4.0–10.5)

## 2020-10-02 LAB — COMPREHENSIVE METABOLIC PANEL
ALT: 11 U/L (ref 0–35)
AST: 16 U/L (ref 0–37)
Albumin: 4.2 g/dL (ref 3.5–5.2)
Alkaline Phosphatase: 63 U/L (ref 39–117)
BUN: 78 mg/dL — ABNORMAL HIGH (ref 6–23)
CO2: 32 mEq/L (ref 19–32)
Calcium: 10.3 mg/dL (ref 8.4–10.5)
Chloride: 102 mEq/L (ref 96–112)
Creatinine, Ser: 1.71 mg/dL — ABNORMAL HIGH (ref 0.40–1.20)
GFR: 26.15 mL/min — ABNORMAL LOW (ref >60.00)
Glucose, Bld: 87 mg/dL (ref 70–99)
Potassium: 3.3 mEq/L — ABNORMAL LOW (ref 3.5–5.1)
Sodium: 145 mEq/L (ref 135–145)
Total Bilirubin: 0.4 mg/dL (ref 0.2–1.2)
Total Protein: 7.5 g/dL (ref 6.0–8.3)

## 2020-10-02 LAB — POCT URINALYSIS DIPSTICK
Bilirubin, UA: NEGATIVE
Blood, UA: NEGATIVE
Glucose, UA: NEGATIVE
Ketones, UA: NEGATIVE
Nitrite, UA: NEGATIVE
Protein, UA: NEGATIVE
Spec Grav, UA: 1.015 (ref 1.010–1.025)
Urobilinogen, UA: 0.2 E.U./dL
pH, UA: 6 (ref 5.0–8.0)

## 2020-10-02 LAB — LIPID PANEL
Cholesterol: 166 mg/dL (ref 0–200)
HDL: 52.4 mg/dL (ref >39.00)
LDL Cholesterol: 90 mg/dL (ref 0–99)
NonHDL: 114
Total CHOL/HDL Ratio: 3
Triglycerides: 121 mg/dL (ref 0.0–149.0)
VLDL: 24.2 mg/dL (ref 0.0–40.0)

## 2020-10-02 LAB — URINALYSIS, MICROSCOPIC ONLY: RBC / HPF: NONE SEEN (ref 0–?)

## 2020-10-02 LAB — TSH: TSH: 1.91 u[IU]/mL (ref 0.35–4.50)

## 2020-10-02 MED ORDER — SULFAMETHOXAZOLE-TRIMETHOPRIM 800-160 MG PO TABS: 1.0000 | ORAL_TABLET | Freq: Two times a day (BID) | ORAL | 0 refills | Status: AC

## 2020-10-02 NOTE — Patient Instructions (Signed)
As I await the results of her urine studies, please start the septra antibiotic as prescribed. By Monday we will have the final answer on the urine. There may be an infection, but it is not entirely clear yet.   No other medication changes are recommended at this time.   If you have any questions or concerns, please don't hesitate to send me a message via MyChart or call the office at [phone]. Thank you for visiting with Monica Caldwell today! It's our pleasure caring for you.

## 2020-10-02 NOTE — Progress Notes (Signed)
Subjective  CC:  Chief Complaint  Patient presents with  . Urine Odor    Drinking lots of water, urine is clear but has an odor  . Altered Mental Status    Confusion, all of a sudden patient was unable to walk. Patient has started declining     HPI: Monica Caldwell is a 87 y.o. female who presents to the office today to address the problems listed above in the chief complaint.  87 year old female here with her daughter who reports an acute change in her mental status.  This was noted approximately 1 to 2 weeks ago.  Please see her my chart notes.  Patient lives with a different daughter who reported last week that her mom was not doing well.  At that time she was confused and had become weak.  Prior to this her mental status was clear and she is ambulatory.  At that time, they denied any changes in her urine or fevers or complaints of pain.  Over the last week, her confusion has persists and mildly worsened.  Also with bad odor to her urine.  No fevers or chills.  Appetite is good.  No nausea or vomiting.  No diarrhea.  No cough or URI symptoms.  Hypertension: She denies dizziness or lightheadedness.  Blood pressures have been checked at home and are normal.  She continues on her antihypertensives.  No chest pain.  History of heart failure, right-sided: Mild edema persists.  No changes in lower extremity edema.  No shortness of breath.  Chronic kidney disease: Last checked in June of last year.  There was a small bump in her creatinine at that time.  She is overdue for recheck.  She denies symptoms of worsening renal failure.  Hyperlipidemia: She continues on her statin.  No concerns.  Osteoporosis on Prolia: Due for injection.   Assessment  1. Delirium   2. Abnormal urine odor   3. Generalized weakness   4. Stage 3b chronic kidney disease (HCC)   5. Diastolic CHF, chronic (HCC)   6. Hypertension, essential, benign   7. Mixed hyperlipidemia   8. Age-related osteoporosis  without current pathological fracture      Plan   Mental status change with generalized weakness: Acute serious problem with uncertain cause that could be life-threatening.  Thorough exam and education given.  Currently suspect this is due to a urinary tract infection.  Await culture, start Septra and monitor.  Vital signs are stable.  She appears nontoxic.  To emergency room if worsens.  Check lab work and chest x-ray.  Education given to daughter.  Chronic kidney disease: Overdue for recheck.  No change in lower extremity edema.  Denies nausea or vomiting.  Not taking nephrotoxins.  History of diastolic heart failure, chronic: No sign of pulmonary edema on exam.  Check chest x-ray to rule out infiltrate or edema.  Hypertension: She is tolerating her antihypertensives.  She denies lightheadedness.  Hyperlipidemia on statin: Overdue for recheck.  Recheck today.  Osteoporosis on Prolia: Defer injection today given acute serious medical problem.  Follow up: 3 months for recheck, sooner if worsens. Visit date not found  Orders Placed This Encounter  Procedures  . Urine Culture  . DG Chest 2 View  . CBC with Differential/Platelet  . Comprehensive metabolic panel  . TSH  . Lipid panel  . Urine Microscopic Only  . POCT Urinalysis Dipstick   Meds ordered this encounter  Medications  . sulfamethoxazole-trimethoprim (BACTRIM DS) 800-160 MG  tablet    Sig: Take 1 tablet by mouth 2 (two) times daily.    Dispense:  14 tablet    Refill:  0      I reviewed the patients updated PMH, FH, and SocHx.    Patient Active Problem List   Diagnosis Date Noted  . Moderate persistent asthma 06/29/2017    Priority: High  . Hypertension, essential, benign 12/20/2016    Priority: High  . CKD (chronic kidney disease) 12/20/2016    Priority: High  . Diastolic CHF, chronic (HCC) 09/22/2016    Priority: High  . Osteoporosis 12/21/2011    Priority: High  . Mixed hyperlipidemia 12/21/2011     Priority: High  . Nonintractable episodic headache 09/07/2017    Priority: Medium  . History of femur fracture 06/29/2017    Priority: Medium  . Peripheral neuropathy 06/29/2017    Priority: Medium  . Chronic gout of left foot 12/20/2016    Priority: Medium  . Osteoarthritis, multiple sites     Priority: Low  . RBBB 09/22/2016    Priority: Low  . Vitamin D deficiency 12/21/2011    Priority: Low   Current Meds  Medication Sig  . albuterol (VENTOLIN HFA) 108 (90 Base) MCG/ACT inhaler INHALE 2 PUFFS BY MOUTH EVERY 4 HOURS AS NEEDED FOR RESCUE FROM WHEEZING OR SHORTNESS OF BREATH  . diltiazem (CARDIZEM CD) 120 MG 24 hr capsule Take 1 capsule (120 mg total) by mouth daily.  . furosemide (LASIX) 40 MG tablet TAKE 1 TABLET(40 MG) BY MOUTH DAILY. FOLLOW UP APPOINTMENT FOR FURTHER REFILLS  . indomethacin (INDOCIN) 25 MG capsule Take 25 mg by mouth as needed.  . potassium chloride (MICRO-K) 10 MEQ CR capsule TAKE 2 CAPSULES BY MOUTH EVERY DAY  . sulfamethoxazole-trimethoprim (BACTRIM DS) 800-160 MG tablet Take 1 tablet by mouth 2 (two) times daily.  Marland Kitchen triamterene-hydrochlorothiazide (MAXZIDE-25) 37.5-25 MG tablet TAKE 1 TABLET BY MOUTH DAILY    Allergies: Patient has No Known Allergies. Family History: Patient family history includes CAD in her mother; COPD in her brother and mother; Cancer in her mother; Heart attack in her brother; Heart disease in her brother; Hyperlipidemia in her brother; Hypertension in her brother; Lung cancer in her mother. Social History:  Patient  reports that she has never smoked. She has never used smokeless tobacco. She reports that she does not drink alcohol and does not use drugs.  Review of Systems: Constitutional: Negative for fever malaise or anorexia Cardiovascular: negative for chest pain Respiratory: negative for SOB or persistent cough Gastrointestinal: negative for abdominal pain  Objective  Vitals: BP 126/80   Pulse 74   Temp (!) 97.3 F (36.3  C) (Temporal)   Resp 15   Ht 5\' 1"  (1.549 m)   Wt 123 lb (55.8 kg)   SpO2 94%   BMI 23.24 kg/m  General: no acute distress , A&Ox1, this is very different from her baseline.  She is calm, Psych: Normal speech, calm affect, normal interactions. HEENT: PEERL, conjunctiva normal, neck is supple Cardiovascular:  RRR without murmur or gallop.  Respiratory:  Good breath sounds bilaterally, CTAB with normal respiratory effort no rales audible Benign abdomen, no suprapubic tenderness or CVA tenderness Skin:  Warm, no rashes  Office Visit on 10/02/2020  Component Date Value Ref Range Status  . Color, UA 10/02/2020 yellow   Final  . Clarity, UA 10/02/2020 clear   Final  . Glucose, UA 10/02/2020 Negative  Negative Final  . Bilirubin, UA 10/02/2020 negative  Final  . Ketones, UA 10/02/2020 negative   Final  . Spec Grav, UA 10/02/2020 1.015  1.010 - 1.025 Final  . Blood, UA 10/02/2020 negative   Final  . pH, UA 10/02/2020 6.0  5.0 - 8.0 Final  . Protein, UA 10/02/2020 Negative  Negative Final  . Urobilinogen, UA 10/02/2020 0.2  0.2 or 1.0 E.U./dL Final  . Nitrite, UA 10/93/2355 negative   Final  . Leukocytes, UA 10/02/2020 Trace* Negative Final      Commons side effects, risks, benefits, and alternatives for medications and treatment plan prescribed today were discussed, and the patient expressed understanding of the given instructions. Patient is instructed to call or message via MyChart if he/she has any questions or concerns regarding our treatment plan. No barriers to understanding were identified. We discussed Red Flag symptoms and signs in detail. Patient expressed understanding regarding what to do in case of urgent or emergency type symptoms.   Medication list was reconciled, printed and provided to the patient in AVS. Patient instructions and summary information was reviewed with the patient as documented in the AVS. This note was prepared with assistance of Dragon voice recognition  software. Occasional wrong-word or sound-a-like substitutions may have occurred due to the inherent limitations of voice recognition software  This visit occurred during the SARS-CoV-2 public health emergency.  Safety protocols were in place, including screening questions prior to the visit, additional usage of staff PPE, and extensive cleaning of exam room while observing appropriate contact time as indicated for disinfecting solutions.

## 2020-10-04 LAB — URINE CULTURE
MICRO NUMBER:: 11831386
SPECIMEN QUALITY:: ADEQUATE

## 2020-10-05 NOTE — Progress Notes (Signed)
Please call patient: I have reviewed his/her lab results. Urine shows infection. The antibiotics should cure it. This is the likely cause of her recent mental status changes and weakness.  How is she doing? Complete the antibiotics and follow up with me if she is not getting back to her normal self.  As well, potassium is a little low due to her blood pressure/fluid pill. Please clarify: is she using the lasix and potassium at all now or only as needed? Will need to take an addition 20 meq of potassium daily for 2 days.

## 2020-10-31 ENCOUNTER — Other Ambulatory Visit: Payer: Self-pay | Admitting: Family Medicine

## 2020-11-07 ENCOUNTER — Other Ambulatory Visit: Payer: Self-pay | Admitting: Family Medicine

## 2021-02-01 ENCOUNTER — Other Ambulatory Visit: Payer: Self-pay | Admitting: Family Medicine

## 2021-03-01 ENCOUNTER — Other Ambulatory Visit: Payer: Self-pay | Admitting: Family Medicine

## 2021-05-07 ENCOUNTER — Other Ambulatory Visit: Payer: Self-pay | Admitting: Family Medicine

## 2021-06-12 ENCOUNTER — Other Ambulatory Visit: Payer: Self-pay | Admitting: Family Medicine

## 2021-07-20 ENCOUNTER — Encounter: Payer: Self-pay | Admitting: Family Medicine

## 2021-07-21 ENCOUNTER — Other Ambulatory Visit: Payer: Self-pay

## 2021-07-22 ENCOUNTER — Other Ambulatory Visit: Payer: Self-pay

## 2021-07-22 DIAGNOSIS — M159 Polyosteoarthritis, unspecified: Secondary | ICD-10-CM

## 2021-07-22 DIAGNOSIS — R531 Weakness: Secondary | ICD-10-CM

## 2021-07-27 ENCOUNTER — Encounter: Payer: Self-pay | Admitting: Family Medicine

## 2021-07-27 NOTE — Telephone Encounter (Signed)
Sent a message to Linus Galas to check status of referral; also notated the referral communications tab.

## 2021-07-29 ENCOUNTER — Telehealth: Payer: Self-pay

## 2021-07-29 NOTE — Telephone Encounter (Signed)
Please advise 

## 2021-07-29 NOTE — Telephone Encounter (Signed)
Has received order for Hospice care.  Would like to know if this is hospice or palliative care?  Also would like to know if Dr. Jonni Sanger will be the attending MD?

## 2021-08-03 NOTE — Telephone Encounter (Signed)
Spoke with Jasmin, gave yes per Dr Mardelle Matte

## 2021-08-05 ENCOUNTER — Other Ambulatory Visit: Payer: Self-pay | Admitting: Family Medicine

## 2021-09-02 ENCOUNTER — Encounter: Payer: Self-pay | Admitting: Family Medicine

## 2021-09-06 ENCOUNTER — Other Ambulatory Visit: Payer: Self-pay | Admitting: Family Medicine

## 2021-09-06 NOTE — Telephone Encounter (Signed)
Lvm for the pt's daughter-Cheryl, to review message on My Chart or call the office back. ?

## 2021-09-11 ENCOUNTER — Other Ambulatory Visit: Payer: Self-pay | Admitting: Family Medicine

## 2021-09-30 ENCOUNTER — Telehealth: Payer: Self-pay

## 2021-09-30 MED ORDER — TRAMADOL HCL 50 MG PO TABS: 50.0000 mg | ORAL_TABLET | Freq: Four times a day (QID) | ORAL | 0 refills | Status: AC | PRN

## 2021-09-30 NOTE — Telephone Encounter (Signed)
Spoke with Shanda Bumps and gave verbal order for pt for 13 more weeks.  ?

## 2021-09-30 NOTE — Telephone Encounter (Signed)
Requesting Recert for 13 more weeks.    Also, states patient has pain for the last several weeks.  Has been using tylenol.  Patient is bed bound.  States that patient can not even be turned due to every time someone touches her she squeals.  Is requesting a pain med to be sent to [facility] in [place].

## 2021-09-30 NOTE — Telephone Encounter (Signed)
Please give verbal order for recertification.  ?And I have ordered tramadol to be used as needed for pain. Please let me know if this is not effective.  ? ?

## 2021-10-15 ENCOUNTER — Telehealth: Payer: Self-pay | Admitting: Family Medicine

## 2021-10-15 NOTE — Telephone Encounter (Signed)
Funeral Service states death certificate can be seen in "DAVE", but is not signed. ? ?F&D is attempting to complete cremation services this weekend. ? ?Per TP, routing phone note directly to provider. ?

## 2021-11-04 DEATH — deceased
